# Patient Record
Sex: Male | Born: 1985
Health system: Southern US, Community
[De-identification: ages and names within clinical notes are randomized; demographics above are authoritative.]

## PROBLEM LIST (undated history)

## (undated) DIAGNOSIS — F1111 Opioid abuse, in remission: Secondary | ICD-10-CM

## (undated) HISTORY — PX: JOINT REPLACEMENT: SHX530

---
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
## 2004-01-02 ENCOUNTER — Emergency Department (HOSPITAL_COMMUNITY): Admission: EM | Admit: 2004-01-02 | Discharge: 2004-01-02 | Payer: Self-pay | Admitting: Emergency Medicine

## 2004-08-02 ENCOUNTER — Emergency Department (HOSPITAL_COMMUNITY): Admission: EM | Admit: 2004-08-02 | Discharge: 2004-08-03 | Payer: Self-pay | Admitting: Emergency Medicine

## 2004-09-13 ENCOUNTER — Emergency Department (HOSPITAL_COMMUNITY): Admission: EM | Admit: 2004-09-13 | Discharge: 2004-09-13 | Payer: Self-pay | Admitting: *Deleted

## 2004-12-31 ENCOUNTER — Emergency Department (HOSPITAL_COMMUNITY): Admission: EM | Admit: 2004-12-31 | Discharge: 2004-12-31 | Payer: Self-pay | Admitting: Emergency Medicine

## 2005-05-13 ENCOUNTER — Emergency Department (HOSPITAL_COMMUNITY): Admission: EM | Admit: 2005-05-13 | Discharge: 2005-05-13 | Payer: Self-pay | Admitting: Emergency Medicine

## 2006-07-12 ENCOUNTER — Emergency Department (HOSPITAL_COMMUNITY): Admission: EM | Admit: 2006-07-12 | Discharge: 2006-07-13 | Payer: Self-pay | Admitting: Emergency Medicine

## 2006-10-03 ENCOUNTER — Emergency Department (HOSPITAL_COMMUNITY): Admission: EM | Admit: 2006-10-03 | Discharge: 2006-10-03 | Payer: Self-pay | Admitting: Family Medicine

## 2007-11-07 ENCOUNTER — Emergency Department (HOSPITAL_COMMUNITY): Admission: EM | Admit: 2007-11-07 | Discharge: 2007-11-07 | Payer: Self-pay | Admitting: Emergency Medicine

## 2009-10-03 ENCOUNTER — Ambulatory Visit: Payer: Self-pay

## 2010-01-20 ENCOUNTER — Emergency Department (HOSPITAL_COMMUNITY): Admission: EM | Admit: 2010-01-20 | Discharge: 2010-01-20 | Payer: Self-pay | Admitting: Emergency Medicine

## 2010-03-17 ENCOUNTER — Emergency Department: Payer: Self-pay | Admitting: Emergency Medicine

## 2011-01-30 ENCOUNTER — Emergency Department: Payer: Self-pay | Admitting: Emergency Medicine

## 2011-03-06 ENCOUNTER — Emergency Department: Payer: Self-pay | Admitting: Emergency Medicine

## 2011-03-11 LAB — HEMOCCULT GUIAC POC 1CARD (OFFICE): Fecal Occult Bld: NEGATIVE

## 2011-05-11 NOTE — Op Note (Signed)
NAME:  TYRICK, DUNAGAN NO.:  [id number]   MEDICAL RECORD NO.:  [id number]                   PATIENT TYPE:  OIB   LOCATION:  2856                                 FACILITY:  [facility]   PHYSICIAN:  Burnard Bunting, M.D.                 DATE OF BIRTH:  13-Dec-1986   DATE OF PROCEDURE:  08/10/2003  DATE OF DISCHARGE:  08/10/2003                                 OPERATIVE REPORT   PREOPERATIVE DIAGNOSIS:  Left knee locking.   POSTOPERATIVE DIAGNOSIS:  Left knee plica.   PROCEDURE:  Left knee diagnostic and operative arthroscopy with plica  excision.   SURGEON:  Burnard Bunting, M.D.   ASSISTANT:  Jerolyn Shin. Tresa Res, M.D.   ANESTHESIA:  General endotracheal.   ESTIMATED BLOOD LOSS:  3 mL.   DRAINS:  None.   OPERATIVE FINDINGS:  1. Examination under anesthesia:  Range of motion 0-130 degrees with     stability to varus and valgus stress.  ACL, PCL intact.  Angle blocked,     and no posterior rotatory instability is noted.  2. Diagnostic and operative arthroscopy:     a. Intact suprapatellar pouch.     b. No loose bodies, medial or lateral gutter.     c. Thickened and inflamed medial plica.     d. Intact ACL, PCL.     e. Intact medial compartment with stable meniscus.     f. Intact lateral compartment with stable meniscus, specifically no risk        for a variant-type meniscus was found.     g. No loose bodies present in any compartment including the posterior        compartment.   PROCEDURE IN DETAIL:  The patient was brought to the operating room, where  general endotracheal anesthesia was induced, preoperative IV antibiotics  were administered, and the left leg was prepped with Duraprep solution and  draped in a sterile manner.  The topographical anatomy of the left knee was  identified including the medial and lateral margin of the patellar tendon as  well as the joint line on both the medial and lateral side.  Anterior  inferior lateral portal was  first established.  Diagnostic arthroscopy was  performed.  An anterior inferior medial port was created under direct  visualization.  The patellofemoral compartment was intact, medial and  lateral gutters were free of loose bodies.  The medial compartment was  intact, including articular cartilage of the meniscus.  ACL, PCL intact.  Lateral compartment was intact and the meniscus was extensively probed and  found to be stable.  No risk for a variant-type meniscus was present.  The  posterior compartment was entered and no loose bodies were seen.  There was  a thickened plica, which was inflamed and hypertrophic.  This was resected  with the basket, punch, and forceps.  At this  time the knee joint was thoroughly irrigated.  Instruments were  removed.  Portals were closed using 3-0 nylon suture, bulky dressing and ice  pack were applied.  The patient tolerated the procedure well without  immediate complications.                                               Burnard Bunting, M.D.    GSD/MEDQ  D:  09/23/2003  T:  09/23/2003  Job:  [id number]

## 2011-06-18 ENCOUNTER — Emergency Department: Payer: Self-pay | Admitting: Emergency Medicine

## 2011-09-22 ENCOUNTER — Emergency Department: Payer: Self-pay | Admitting: Emergency Medicine

## 2012-05-08 ENCOUNTER — Telehealth: Payer: Self-pay

## 2012-05-08 NOTE — Telephone Encounter (Signed)
[facility] [place] states that pt would like a refill on the generic ultracet. [facility] [place] [phone]

## 2012-05-10 NOTE — Telephone Encounter (Signed)
PT HASN'T BEEN SEEN HERE SINCE 07/12/10. LMOM TO INFORM HIM THAT HE WILL NEED OV.

## 2012-08-31 ENCOUNTER — Emergency Department: Payer: Self-pay | Admitting: Emergency Medicine

## 2012-09-03 ENCOUNTER — Inpatient Hospital Stay: Payer: Self-pay | Admitting: Internal Medicine

## 2012-09-03 LAB — BASIC METABOLIC PANEL
BUN: 16 mg/dL (ref 7–18)
Calcium, Total: 9.4 mg/dL (ref 8.5–10.1)
Chloride: 106 mmol/L (ref 98–107)
Co2: 27 mmol/L (ref 21–32)
Creatinine: 0.84 mg/dL (ref 0.60–1.30)
EGFR (African American): >60
Glucose: 109 mg/dL — ABNORMAL HIGH (ref 65–99)
Potassium: 3.7 mmol/L (ref 3.5–5.1)
Sodium: 140 mmol/L (ref 136–145)

## 2012-09-03 LAB — CBC
HGB: 14.4 g/dL (ref 13.0–18.0)
Platelet: 270 10*3/uL (ref 150–440)
RBC: 4.79 10*6/uL (ref 4.40–5.90)

## 2012-09-04 LAB — BASIC METABOLIC PANEL
Calcium, Total: 8.7 mg/dL (ref 8.5–10.1)
Chloride: 104 mmol/L (ref 98–107)
Co2: 28 mmol/L (ref 21–32)
EGFR (African American): >60
EGFR (Non-African Amer.): >60
Osmolality: 278 (ref 275–301)
Potassium: 3.6 mmol/L (ref 3.5–5.1)

## 2012-09-04 LAB — CBC WITH DIFFERENTIAL/PLATELET
Basophil #: 0.1 10*3/uL (ref 0.0–0.1)
Eosinophil #: 0.7 10*3/uL (ref 0.0–0.7)
HGB: 13.6 g/dL (ref 13.0–18.0)
Lymphocyte #: 4 10*3/uL — ABNORMAL HIGH (ref 1.0–3.6)
MCH: 30.8 pg (ref 26.0–34.0)
MCV: 89 fL (ref 80–100)
Monocyte #: 0.9 x10 3/mm (ref 0.2–1.0)
Platelet: 242 10*3/uL (ref 150–440)
RBC: 4.43 10*6/uL (ref 4.40–5.90)
RDW: 12.9 % (ref 11.5–14.5)

## 2012-09-05 LAB — VANCOMYCIN, TROUGH: Vancomycin, Trough: 17 ug/mL (ref 10–20)

## 2012-09-09 LAB — CULTURE, BLOOD (SINGLE)

## 2013-11-18 ENCOUNTER — Emergency Department: Payer: Self-pay | Admitting: Emergency Medicine

## 2013-12-15 ENCOUNTER — Emergency Department: Payer: Self-pay | Admitting: Emergency Medicine

## 2013-12-15 LAB — COMPREHENSIVE METABOLIC PANEL
Albumin: 3.8 g/dL (ref 3.4–5.0)
Alkaline Phosphatase: 70 U/L
Bilirubin,Total: 0.3 mg/dL (ref 0.2–1.0)
Calcium, Total: 8.8 mg/dL (ref 8.5–10.1)
Co2: 30 mmol/L (ref 21–32)
EGFR (Non-African Amer.): >60
Osmolality: 278 (ref 275–301)
SGPT (ALT): 19 U/L (ref 12–78)
Sodium: 140 mmol/L (ref 136–145)

## 2013-12-15 LAB — CBC WITH DIFFERENTIAL/PLATELET
Basophil #: 0.1 10*3/uL (ref 0.0–0.1)
Basophil %: 0.6 %
Lymphocyte %: 33.6 %
MCH: 30.1 pg (ref 26.0–34.0)
MCV: 89 fL (ref 80–100)
Monocyte #: 0.7 x10 3/mm (ref 0.2–1.0)
Neutrophil #: 4.8 10*3/uL (ref 1.4–6.5)
Platelet: 246 10*3/uL (ref 150–440)
WBC: 9 10*3/uL (ref 3.8–10.6)

## 2015-04-12 NOTE — Discharge Summary (Signed)
PATIENT NAME:  Nathan [name], Nathan [name] MR#:  [id number] DATE OF BIRTH:  [birth year]  DATE OF ADMISSION:  09/03/2012 DATE OF DISCHARGE:  09/05/2012  PRESENTING COMPLAINT: Right knee erythema and pain.    DISCHARGE DIAGNOSES:  1. Right knee cellulitis, improved.  2. Right leg pain.   MEDICATIONS AT DISCHARGE:  1. Clindamycin 300 mg p.o. q.i.d. for 10 days.  2. Ibuprofen 400 mg every eight hours.  3. Acetaminophen/oxycodone 325/5, 1 tablet every eight hours as needed.   DIET: Regular.   FOLLOW UP: Follow up with urgent care or ER as needed.   LABORATORY, DIAGNOSTIC AND RADIOLOGICAL DATA: Blood cultures negative in 36 hours.   CBC within normal limits. Right knee x-ray shows no joint effusion. Chemistries within normal limits.   BRIEF SUMMARY OF HOSPITAL COURSE: Mr. Nathan [name] is a 29 year old Caucasian gentleman with no significant past medical history other than smoking comes in with:  1. Worsening right knee cellulitis. He was admitted with right knee cellulitis. Patient did have some erythema and tenderness. White count was normal. No fever was noted. He has history of MRSA, was started on IV vancomycin. Patient's erythema almost resolved prior to discharge, was changed back to p.o. clindamycin which he already has, will complete the course. Blood cultures remained negative. X-ray of the knee showed no effusion and patient was also recommended to take ibuprofen 400 mg t.i.d. nausea, vomiting, diarrhea improved.  2. Tobacco use disorder. Smoking cessation advised.  3. Patient was seen by physical therapy, tolerated ambulation well and does not have any physical therapy needs at home.    TIME SPENT: 40 minutes.  ____________________________ Wylie HailSona A. Allena KatzPatel, MD sap:cms D: 09/05/2012 12:34:13 ET T: 09/06/2012 09:39:18 ET JOB#: [id number]  cc: Antwain Caliendo A. Allena KatzPatel, MD, <Dictator> Willow OraSONA A Marysol Wellnitz MD ELECTRONICALLY SIGNED 09/08/2012 22:16

## 2015-04-12 NOTE — H&P (Signed)
PATIENT NAME:  Nathan Nathan [name], Nathan Nathan [name]#:  [id number] DATE OF BIRTH:  1986-07-21  DATE OF ADMISSION:  09/03/2012  ADMITTING PHYSICIAN: Enid Baas, MD   PRIMARY CARE PHYSICIAN: None.   CHIEF COMPLAINT: Right knee pain and worsening erythema.   HISTORY OF PRESENT ILLNESS: Nathan [name]. Nathan Nathan [name] is a 29 year old young Caucasian male with no significant past medical history other than smoking, presented to the ED with worsening right knee pain and also erythema. 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 He denies any trauma to the knee but has noticed  like a small boil and then redness with swelling around the right knee. He came to the ER about three days ago, was discharged home on clindamycin 300 mg q.i.d. Prior to that, he had a few Bactrim pills at home which he had tried without any help. In spite of Clindamycin, the redness had started spreading up to his thigh and down his leg, and he had a fever of 102 degrees Fahrenheit last night. He presents back to the ER.  He is also complaining of some nausea, vomiting and a two-day history of diarrhea which improved today. So, he is being admitted for cellulitis, failing outpatient treatment.   PAST MEDICAL HISTORY: Tobacco use disorder.   PAST SURGICAL HISTORY:  1. Torn anterior cruciate ligament and meniscal repair of the left knee, football injury.  2. Right shoulder repair three years ago from a trauma.  3. Left arm repair from a trauma.  4. Both forearms repaired from trauma.   ALLERGIES: He is intolerance to alcohol.   HOME MEDICATIONS:  He is currently using clindamycin 300 mg p.o. every 6 hours and also Norco for pain started three days ago by the ER.   SOCIAL HISTORY: He lives at home with his wife and four kids.  He smokes 1/2 pack per day and denies any alcohol use.  FAMILY HISTORY: Father with diabetes, hypertension. Mom is otherwise healthy.   REVIEW OF SYSTEMS: CONSTITUTIONAL: Positive for fever. No  fatigue or weakness. EYES: No blurred vision, double vision, glaucoma, or cataracts. ENT: No tinnitus, ear pain, epistaxis or discharge. Positive for headache. RESPIRATORY: No cough, wheeze, hemoptysis, or chronic obstructive pulmonary disease. CARDIOVASCULAR: No chest pain, orthopnea, edema, arrhythmia, or palpitations. GASTROINTESTINAL: Positive for nausea, vomiting, and diarrhea. No abdominal pain, hematemesis, or rectal bleed. GENITOURINARY: No dysuria, hematuria, renal calculus, frequency, or incontinence. ENDOCRINE: No polyuria, nocturia, thyroid problems, heat or cold intolerance. HEMATOLOGY: No anemia, easy bruising or bleeding. SKIN: Positive for cellulitis, rash on the right leg from the knee spreading to the  medial side of the thigh. NEUROLOGIC: No numbness, weakness, cerebrovascular accident, transient ischemic attack, or seizures. PSYCHOLOGICAL: No anxiety, insomnia, or depression.   PHYSICAL EXAMINATION:  VITAL SIGNS: Temperature here is 97 degrees Fahrenheit, pulse 94, respirations 20, blood pressure 143/77, pulse oximetry 97% on room air.   GENERAL: Well built, well nourished male, sitting in chair not in any acute distress.   HEENT: Normocephalic, atraumatic. Pupils are equal, round, reacting to light. Anicteric sclerae. Extraocular movements intact. Oropharynx clear without erythema, mass or exudates.   NECK: Supple, no thyromegaly, jugular venous distention or carotid bruits. No lymphadenopathy.   LUNGS: Moving air bilaterally. No wheeze or crackles. No use of accessory muscles for breathing.   CARDIOVASCULAR: S1, S2 regular rate and rhythm. No murmurs, rubs, or gallops.   ABDOMEN: Soft, nontender, nondistended. No hepatosplenomegaly. Normal bowel sounds.   EXTREMITIES: No pedal  edema. No clubbing or cyanosis. There is erythema with soft tissue swelling especially on the medial side of the knees spreading onto the medial side of the high and also patchy erythema seen spreading  down to the leg, too, on the right side. It is very tender to touch, especially along the medial side of the knee.   SKIN: Other than above-mentioned rash, there are no rashes or lesions.  NEUROLOGIC: Cranial nerves intact. No focal motor or sensory deficits.   PSYCHOLOGICAL: The patient is awake, alert, oriented x3.   LABORATORY, DIAGNOSTIC AND RADIOLOGICAL DATA: WBC 10.6, hemoglobin 14.4, hematocrit 43.1, platelet count 270. Sodium 140, potassium 3.7, chloride 106, bicarbonate  27, BUN 16, creatinine 0.84, glucose 109, and calcium of 9.4. Right knee x-ray showing no acute abnormalities of the right knee. No evidence of fracture or dislocation. No joint effusion is seen. Overlying soft tissues exhibit no gas collection or foreign body.   ASSESSMENT/PLAN: A 29 year old male with no significant  past medical history other than smoking, comes for worsening right knee cellulitis failing outpatient clindamycin.   1. Right knee cellulitis: Not improved on clindamycin started three days ago at the Emergency Room with spreading, erythema, more tenderness and also fevers at home. No white count here. Prior history of methicillin-resistant Staphylococcus aureus. We will admit and start IV fluids and IV vancomycin for now and monitor.  2. Nausea, vomiting, diarrhea: Could have been side effects to clindamycin. He does not have any diarrhea now. If he has further diarrhea, we will need to check for C. difficile.  3. Tobacco use disorder: He was counseled for three minutes and started on a nicotine patch.  4. Right knee tenderness: Likely from cellulitis. No joint effusion on x-ray.   CODE STATUS:  FULL CODE.    TIME SPENT ON ADMISSION: 50 minutes.    ____________________________ Enid Baas, MD rk:cbb D: 09/03/2012 17:31:36 ET T: 09/03/2012 17:57:30 ET JOB#: [id number]  cc: Enid Baas, MD, <Dictator> Enid Baas MD ELECTRONICALLY SIGNED 09/03/2012 19:00

## 2015-09-30 ENCOUNTER — Ambulatory Visit (INDEPENDENT_AMBULATORY_CARE_PROVIDER_SITE_OTHER): Payer: PRIVATE HEALTH INSURANCE | Admitting: Emergency Medicine

## 2015-09-30 VITALS — BP 104/72 | HR 61 | Temp 97.8°F | Resp 16 | Ht 71.0 in | Wt 215.0 lb

## 2015-09-30 DIAGNOSIS — J0140 Acute pansinusitis, unspecified: Secondary | ICD-10-CM

## 2015-09-30 MED ORDER — AMOXICILLIN-POT CLAVULANATE 875-125 MG PO TABS: 1.0000 | ORAL_TABLET | Freq: Two times a day (BID) | ORAL | Status: DC

## 2015-09-30 MED ORDER — PSEUDOEPHEDRINE-GUAIFENESIN ER 60-600 MG PO TB12: 1.0000 | ORAL_TABLET | Freq: Two times a day (BID) | ORAL | Status: AC

## 2015-09-30 NOTE — Patient Instructions (Signed)

## 2015-09-30 NOTE — Progress Notes (Signed)
Subjective:  Patient ID: Nathan [name], male    DOB: Nov 04, 1986  Age: 29 y.o. MRN: [id number]  CC: Sore Throat; Fatigue; and Ear Pain   HPI Nathan [name] presents  with sore throat. He says both his children have had strep throat. He has no fever or chills. Has nasal congestion with postnasal green drainage. He has a cough that's intermittent. He has no wheezing or shortness of breath. Has no nausea vomiting. No stool change. No rash. He has no improvement with over-the-counter medication.  History Nathan [name] has no past medical history on file.   He has past surgical history that includes Joint replacement.   His  family history includes Diabetes in his father; Hyperlipidemia in his father and mother.  He   reports that he has never smoked. He uses smokeless tobacco. His alcohol and drug histories are not on file.  No outpatient prescriptions prior to visit.   No facility-administered medications prior to visit.    Social History   Social History  . Marital Status: Single    Spouse Name: N/A  . Number of Children: N/A  . Years of Education: N/A   Social History Main Topics  . Smoking status: Never Smoker   . Smokeless tobacco: Current User  . Alcohol Use: None  . Drug Use: None  . Sexual Activity: Not Asked   Other Topics Concern  . None   Social History Narrative  . None     Review of Systems  Constitutional: Positive for fatigue. Negative for fever, chills and appetite change.  HENT: Positive for congestion, postnasal drip, rhinorrhea and sore throat. Negative for ear pain and sinus pressure.   Eyes: Negative for pain and redness.  Respiratory: Negative for cough, shortness of breath and wheezing.   Cardiovascular: Negative for leg swelling.  Gastrointestinal: Negative for nausea, vomiting, abdominal pain, diarrhea, constipation and blood in stool.  Endocrine: Negative for polyuria.  Genitourinary: Negative for dysuria, urgency, frequency and flank pain.    Musculoskeletal: Negative for gait problem.  Skin: Negative for rash.  Neurological: Negative for weakness and headaches.  Psychiatric/Behavioral: Negative for confusion and decreased concentration. The patient is not nervous/anxious.     Objective:  BP 104/72 mmHg  Pulse 61  Temp(Src) 97.8 F (36.6 C) (Oral)  Resp 16  Ht  (1.803 m)  Wt 215 lb (97.523 kg)  BMI 30.00 kg/m2  SpO2 98%  Physical Exam  Constitutional: He is oriented to person, place, and time. He appears well-developed and well-nourished. No distress.  HENT:  Head: Normocephalic and atraumatic.  Right Ear: External ear normal.  Left Ear: External ear normal.  Nose: Nose normal.  Eyes: Conjunctivae and EOM are normal. Pupils are equal, round, and reactive to light. No scleral icterus.  Neck: Normal range of motion. Neck supple. No tracheal deviation present.  Cardiovascular: Normal rate, regular rhythm and normal heart sounds.   Pulmonary/Chest: Effort normal. No respiratory distress. He has no wheezes. He has no rales.  Abdominal: He exhibits no mass. There is no tenderness. There is no rebound and no guarding.  Musculoskeletal: He exhibits no edema.  Lymphadenopathy:    He has no cervical adenopathy.  Neurological: He is alert and oriented to person, place, and time. Coordination normal.  Skin: Skin is warm and dry. No rash noted.  Psychiatric: He has a normal mood and affect. His behavior is normal.      Assessment & Plan:   Nathan [name] was seen today for sore throat, fatigue  and ear pain.  Diagnoses and all orders for this visit:  Acute pansinusitis, recurrence not specified  Other orders -     pseudoephedrine-guaifenesin (MUCINEX D) 60-600 MG 12 hr tablet; Take 1 tablet by mouth every 12 (twelve) hours. -     amoxicillin-clavulanate (AUGMENTIN) 875-125 MG tablet; Take 1 tablet by mouth 2 (two) times daily.  I am having Nathan [name] start on pseudoephedrine-guaifenesin and  amoxicillin-clavulanate.  Meds ordered this encounter  Medications  . pseudoephedrine-guaifenesin (MUCINEX D) 60-600 MG 12 hr tablet    Sig: Take 1 tablet by mouth every 12 (twelve) hours.    Dispense:  18 tablet    Refill:  0  . amoxicillin-clavulanate (AUGMENTIN) 875-125 MG tablet    Sig: Take 1 tablet by mouth 2 (two) times daily.    Dispense:  20 tablet    Refill:  0    Appropriate red flag conditions were discussed with the patient as well as actions that should be taken.  Patient expressed his understanding.  Follow-up: Return if symptoms worsen or fail to improve.  Nathan Dane, MD

## 2016-12-23 ENCOUNTER — Emergency Department: Payer: PRIVATE HEALTH INSURANCE

## 2016-12-23 ENCOUNTER — Encounter: Payer: Self-pay | Admitting: Emergency Medicine

## 2016-12-23 ENCOUNTER — Observation Stay
Admission: EM | Admit: 2016-12-23 | Discharge: 2016-12-24 | Disposition: A | Discharge status: Home / Self Care | Payer: PRIVATE HEALTH INSURANCE | Attending: Internal Medicine | Admitting: Internal Medicine

## 2016-12-23 DIAGNOSIS — K529 Noninfective gastroenteritis and colitis, unspecified: Principal | ICD-10-CM | POA: Insufficient documentation

## 2016-12-23 DIAGNOSIS — I951 Orthostatic hypotension: Secondary | ICD-10-CM | POA: Diagnosis present

## 2016-12-23 DIAGNOSIS — K760 Fatty (change of) liver, not elsewhere classified: Secondary | ICD-10-CM | POA: Diagnosis not present

## 2016-12-23 DIAGNOSIS — E860 Dehydration: Secondary | ICD-10-CM | POA: Diagnosis not present

## 2016-12-23 DIAGNOSIS — R000 Tachycardia, unspecified: Secondary | ICD-10-CM | POA: Insufficient documentation

## 2016-12-23 DIAGNOSIS — R1110 Vomiting, unspecified: Secondary | ICD-10-CM

## 2016-12-23 DIAGNOSIS — R112 Nausea with vomiting, unspecified: Secondary | ICD-10-CM | POA: Diagnosis present

## 2016-12-23 DIAGNOSIS — R197 Diarrhea, unspecified: Secondary | ICD-10-CM

## 2016-12-23 HISTORY — DX: Opioid abuse, in remission: F11.11

## 2016-12-23 LAB — CBC
HEMATOCRIT: 50.7 % (ref 40.0–52.0)
HEMOGLOBIN: 17.7 g/dL (ref 13.0–18.0)
MCH: 30.7 pg (ref 26.0–34.0)
MCHC: 34.8 g/dL (ref 32.0–36.0)
MCV: 88 fL (ref 80.0–100.0)
Platelets: 310 10*3/uL (ref 150–440)
RBC: 5.76 MIL/uL (ref 4.40–5.90)
RDW: 12.7 % (ref 11.5–14.5)
WBC: 17.3 10*3/uL — AB (ref 3.8–10.6)

## 2016-12-23 LAB — GASTROINTESTINAL PANEL BY PCR, STOOL (REPLACES STOOL CULTURE)
ADENOVIRUS F40/41: NOT DETECTED
Astrovirus: NOT DETECTED
CAMPYLOBACTER SPECIES: NOT DETECTED
CRYPTOSPORIDIUM: NOT DETECTED
CYCLOSPORA CAYETANENSIS: NOT DETECTED
ENTEROPATHOGENIC E COLI (EPEC): NOT DETECTED
Entamoeba histolytica: NOT DETECTED
Enteroaggregative E coli (EAEC): NOT DETECTED
Enterotoxigenic E coli (ETEC): NOT DETECTED
Giardia lamblia: NOT DETECTED
Norovirus GI/GII: NOT DETECTED
PLESIMONAS SHIGELLOIDES: NOT DETECTED
Rotavirus A: NOT DETECTED
SAPOVIRUS (I, II, IV, AND V): NOT DETECTED
Salmonella species: NOT DETECTED
Shiga like toxin producing E coli (STEC): NOT DETECTED
Shigella/Enteroinvasive E coli (EIEC): NOT DETECTED
VIBRIO SPECIES: NOT DETECTED
Vibrio cholerae: NOT DETECTED
YERSINIA ENTEROCOLITICA: NOT DETECTED

## 2016-12-23 LAB — COMPREHENSIVE METABOLIC PANEL
ALBUMIN: 4.9 g/dL (ref 3.5–5.0)
ALK PHOS: 78 U/L (ref 38–126)
ALT: 37 U/L (ref 17–63)
AST: 39 U/L (ref 15–41)
Anion gap: 7 (ref 5–15)
BILIRUBIN TOTAL: 0.3 mg/dL (ref 0.3–1.2)
BUN: 17 mg/dL (ref 6–20)
CALCIUM: 9.7 mg/dL (ref 8.9–10.3)
CO2: 29 mmol/L (ref 22–32)
Chloride: 103 mmol/L (ref 101–111)
Creatinine, Ser: 1.2 mg/dL (ref 0.61–1.24)
GFR calc Af Amer: >60 mL/min (ref >60)
GFR calc non Af Amer: >60 mL/min (ref >60)
GLUCOSE: 121 mg/dL — AB (ref 65–99)
Potassium: 3.4 mmol/L — ABNORMAL LOW (ref 3.5–5.1)
Sodium: 139 mmol/L (ref 135–145)
TOTAL PROTEIN: 8.7 g/dL — AB (ref 6.5–8.1)

## 2016-12-23 LAB — LIPASE, BLOOD: Lipase: 18 U/L (ref 11–51)

## 2016-12-23 LAB — C DIFFICILE QUICK SCREEN W PCR REFLEX
C DIFFICILE (CDIFF) INTERP: NOT DETECTED
C Diff antigen: NEGATIVE
C Diff toxin: NEGATIVE

## 2016-12-23 LAB — LACTIC ACID, PLASMA: LACTIC ACID, VENOUS: 1 mmol/L (ref 0.5–1.9)

## 2016-12-23 MED ORDER — ONDANSETRON HCL 4 MG PO TABS: 4.0000 mg | ORAL_TABLET | Freq: Four times a day (QID) | ORAL | Status: DC | PRN

## 2016-12-23 MED ORDER — SODIUM CHLORIDE 0.9 % IV BOLUS (SEPSIS)
1000.0000 mL | Freq: Once | INTRAVENOUS | Status: AC
  Administered 2016-12-23: 1000 mL via INTRAVENOUS

## 2016-12-23 MED ORDER — SODIUM CHLORIDE 0.9 % IV BOLUS (SEPSIS): 1000.0000 mL | Freq: Once | INTRAVENOUS | Status: DC

## 2016-12-23 MED ORDER — LOPERAMIDE HCL 2 MG PO CAPS
2.0000 mg | ORAL_CAPSULE | Freq: Once | ORAL | Status: AC
  Administered 2016-12-23: 2 mg via ORAL
  Filled 2016-12-23: qty 1

## 2016-12-23 MED ORDER — LOPERAMIDE HCL 2 MG PO CAPS: 2.0000 mg | ORAL_CAPSULE | ORAL | Status: DC | PRN

## 2016-12-23 MED ORDER — ONDANSETRON HCL 4 MG/2ML IJ SOLN
4.0000 mg | Freq: Four times a day (QID) | INTRAMUSCULAR | Status: DC | PRN
Start: 2016-12-23 — End: 2016-12-24

## 2016-12-23 MED ORDER — BUPRENORPHINE HCL 2 MG SL SUBL
2.0000 mg | SUBLINGUAL_TABLET | Freq: Every day | SUBLINGUAL | Status: DC
  Filled 2016-12-23: qty 1

## 2016-12-23 MED ORDER — ACETAMINOPHEN 650 MG RE SUPP: 650.0000 mg | Freq: Four times a day (QID) | RECTAL | Status: DC | PRN

## 2016-12-23 MED ORDER — IOPAMIDOL (ISOVUE-300) INJECTION 61%
100.0000 mL | Freq: Once | INTRAVENOUS | Status: AC | PRN
  Administered 2016-12-23: 100 mL via INTRAVENOUS

## 2016-12-23 MED ORDER — ACETAMINOPHEN 325 MG PO TABS
650.0000 mg | ORAL_TABLET | Freq: Four times a day (QID) | ORAL | Status: DC | PRN
  Administered 2016-12-24: 650 mg via ORAL
  Filled 2016-12-23: qty 2

## 2016-12-23 MED ORDER — SODIUM CHLORIDE 0.9 % IV SOLN
INTRAVENOUS | Status: AC
  Administered 2016-12-24 (×2): via INTRAVENOUS

## 2016-12-23 MED ORDER — ENOXAPARIN SODIUM 40 MG/0.4ML ~~LOC~~ SOLN: 40.0000 mg | SUBCUTANEOUS | Status: DC

## 2016-12-23 NOTE — ED Notes (Signed)
Pt assisted to restroom.  

## 2016-12-23 NOTE — ED Triage Notes (Addendum)
Pt arrived via EMS from home with reports of diarrhea and nausea for the pas 2 - 2.5 hours.  Pt states diarrhea has been non stop for the past 2 hours.  Vomited several times. Mouth dry, received 8mg  IV zofran and 500 cc NaCl Pt takes Suboxone and is in a program. Pt states this is not w/d sxs

## 2016-12-23 NOTE — ED Provider Notes (Signed)
[facility] Emergency Department Provider Note   ____________________________________________   First MD Initiated Contact with Patient 12/23/16 1652     (approximate)  I have reviewed the triage vital signs and the nursing notes.   HISTORY  Chief Complaint Diarrhea and Emesis    HPI Nathan [name] is a 30 y.o. male ports no significant medical history other than previous opioid use Suboxone use currently  Patient visiting family today about 2 hours agohe developed sudden onset of stomach cramps, multiple loose watery stools, vomiting "stomach acid" multiple times in the last 2 hours. Reports numerous amounts of vomiting as well as loose stools. Crampy abdominal pain. No fevers or chills. No bad food or travel. No recent uncooked food. Reports no known sick contacts.  No chest pain or shortness of breath. He felt very lightheaded after having multiple rounds of stool and called EMS. He was initially found to be tachycardic with a heart rate about 140, given normal saline Route with improvement. Also Zofran with no further vomiting.   History reviewed. No pertinent past medical history.  There are no active problems to display for this patient.   Past Surgical History:  Procedure Laterality Date  . JOINT REPLACEMENT      Prior to Admission medications   Medication Sig Start Date End Date Taking? Authorizing Provider  amoxicillin-clavulanate (AUGMENTIN) 875-125 MG tablet Take 1 tablet by mouth 2 (two) times daily. 09/30/15   Carmelina DaneJeffery S Anderson, MD    Allergies Patient has no known allergies.  Family History  Problem Relation Age of Onset  . Hyperlipidemia Mother   . Hyperlipidemia Father   . Diabetes Father     Social History Social History  Substance Use Topics  . Smoking status: Never Smoker  . Smokeless tobacco: Current User  . Alcohol use Not on file    Review of Systems Constitutional: No fever/chills Eyes: No visual  changes. ENT: No sore throat. Cardiovascular: Denies chest pain. Respiratory: Denies shortness of breath. Gastrointestinal: See history of present illness Genitourinary: Negative for dysuria. Musculoskeletal: Negative for back pain. Skin: Negative for rash. Neurological: Negative for headaches, focal weakness or numbness.  10-point ROS otherwise negative.  ____________________________________________   PHYSICAL EXAM:  VITAL SIGNS: ED Triage Vitals [12/23/16 1634]  Enc Vitals Group     BP 125/89     Pulse Rate (!) 103     Resp 18     Temp 97.4 F (36.3 C)     Temp Source Oral     SpO2 100 %     Weight 184 lb 11.9 oz (83.8 kg)     Height 6\' 1"  (1.854 m)     Head Circumference      Peak Flow      Pain Score      Pain Loc      Pain Edu?      Excl. in GC?     Constitutional: Alert and oriented. Somewhat pale, appears nauseated, generally ill-appearing and appears fatigued but in no obvious extremities. Eyes: Conjunctivae are normal. PERRL. EOMI. Head: Atraumatic. Nose: No congestion/rhinnorhea. Mouth/Throat: Mucous membranes are very dry.  Oropharynx non-erythematous. Neck: No stridor.   Cardiovascular: Tachycardic rate, regular rhythm. Grossly normal heart sounds.  Good peripheral circulation. Respiratory: Normal respiratory effort.  No retractions. Lungs CTAB. Gastrointestinal: Soft and mildly tender throughout, questionably moderate tenderness left lower quadrant greater than remaining quadrants. No rebound or guarding. No focal tenderness at McBurney's point. Musculoskeletal: No lower extremity tenderness nor edema.  Neurologic:  Normal speech and language. No gross focal neurologic deficits are appreciated. Skin:  Skin is cool peripherally, slightly diaphoretic and intact. No rash noted. Psychiatric: Mood and affect are normal. Speech and behavior are normal.  ____________________________________________   LABS (all labs ordered are listed, but only abnormal  results are displayed)  Labs Reviewed  CBC - Abnormal; Notable for the following:       Result Value   WBC 17.3 (*)    All other components within normal limits  COMPREHENSIVE METABOLIC PANEL - Abnormal; Notable for the following:    Potassium 3.4 (*)    Glucose, Bld 121 (*)    Total Protein 8.7 (*)    All other components within normal limits  BLOOD GAS, VENOUS - Abnormal; Notable for the following:    Bicarbonate 28.9 (*)    All other components within normal limits  GASTROINTESTINAL PANEL BY PCR, STOOL (REPLACES STOOL CULTURE)  C DIFFICILE QUICK SCREEN W PCR REFLEX  LIPASE, BLOOD  LACTIC ACID, PLASMA  LACTIC ACID, PLASMA   ____________________________________________  EKG  Reviewed and interpreted by me 1815 Heart rate 1:15 QRS 110 QTc 440 Sinus tachycardia, nonspecific T-wave abnormality ____________________________________________  RADIOLOGY  Dg Abdomen 1 View  Result Date: 12/23/2016 CLINICAL DATA:  Patient with diarrhea and nausea. EXAM: ABDOMEN - 1 VIEW COMPARISON:  CT abdomen pelvis 09/23/2011. FINDINGS: Gas is demonstrated within nondilated loops of large and small bowel in a nonobstructed pattern. Supine evaluation limited for the detection of free intraperitoneal air. Lung bases are clear. Pelvic phlebolith. Osseous skeleton is unremarkable. IMPRESSION: Nonobstructed bowel gas pattern. Electronically Signed   By: Annia Belt M.D.   On: 12/23/2016 17:25   Ct Abdomen Pelvis W Contrast  Result Date: 12/23/2016 CLINICAL DATA:  Diarrhea and nausea EXAM: CT ABDOMEN AND PELVIS WITH CONTRAST TECHNIQUE: Multidetector CT imaging of the abdomen and pelvis was performed using the standard protocol following bolus administration of intravenous contrast. CONTRAST:  ISOVUE-300 IOPAMIDOL (ISOVUE-300) INJECTION 61% COMPARISON:  12/23/2016 radiograph, CT 09/23/2011 FINDINGS: Lower chest: Stable 3 mm subpleural nodule right lower lobe. No acute consolidation or pleural  effusion. Normal heart size. Hepatobiliary: Mild fatty infiltration. Small focal hypodensities near falciform ligament could relate to more focal fatty change. No biliary dilatation. No calcified gallstones. No wall thickening. Pancreas: Unremarkable. No pancreatic ductal dilatation or surrounding inflammatory changes. Spleen: Normal in size without focal abnormality. Adrenals/Urinary Tract: Adrenal glands are unremarkable. Kidneys are normal, without renal calculi, focal lesion, or hydronephrosis. Bladder is unremarkable. Stomach/Bowel: Stomach is nonenlarged. There is no dilated small bowel. Appendix is normal. No significant colon wall thickening. Mild mucosal enhancement of nondilated small bowel loops within the left upper quadrant with scattered mucosal enhancement of the descending and rectosigmoid colon without associated wall thickening. Vascular/Lymphatic: Aorta is of normal caliber. Scattered subcentimeter central mesenteric nonspecific lymph nodes. Reproductive: Prostate is unremarkable. Other: No free air or free fluid. Small amount of fat in the umbilicus. Musculoskeletal: No acute or significant osseous findings. IMPRESSION: 1. No evidence for bowel obstruction, free air or free fluid. Normal appendix. 2. Scattered mucosal enhancement of nondilated small bowel as well as the descending colon and rectosigmoid colon without associated wall thickening. Findings could be secondary to an enteritis. 3. Mild hepatic steatosis Electronically Signed   By: Jasmine Pang M.D.   On: 12/23/2016 19:41    ____________________________________________   PROCEDURES  Procedure(s) performed: None  Procedures  Critical Care performed: No  ____________________________________________   INITIAL IMPRESSION / ASSESSMENT  AND PLAN / ED COURSE  Pertinent labs & imaging results that were available during my care of the patient were reviewed by me and considered in my medical decision making (see chart for  details).  Patient presents for sudden onset severe nausea vomiting and abdominal cramping. Patient continuing to have multiple loose stools, reports projectile emesis at home. Afebrile, but notably tachycardic with EMS arrival with orthostatic like symptoms. No cardiac pulmonary or neurologic abnormality/symptomatology. Based on significance of his symptoms, we'll obtain labs, x-ray, hydrate generously, and he does report vomiting is improved after receiving Zofran with EMS.  ----------------------------------------- 7:14 PM on 12/23/2016 -----------------------------------------  Patient reports feeling somewhat improvedas still had multiple loose very watery brown stools. Sent for culture. No further vomiting, but continues to have loose intractable diarrhea.  Clinical Course    ----------------------------------------- 9:53 PM on 12/23/2016 -----------------------------------------  Patient continuing to feel lightheaded with standing, orthostatics demonstrate heart rate elevates to about 140 with standing and blood pressure drops to the 80s. His artery had 2 L of fluid and I suspect this is likely gastroenteritis given his CT findings and presentation but with severe dehydration and orthostasis. We'll give additional fluid. She reports nausea has improved, and patient agreeable with plan for admission hydration ongoing workup under the hospitalist service.  ____________________________________________   FINAL CLINICAL IMPRESSION(S) / ED DIAGNOSES  Final diagnoses:  Orthostatic hypotension  Vomiting and diarrhea  Dehydration      NEW MEDICATIONS STARTED DURING THIS VISIT:  New Prescriptions   No medications on file     Note:  This document was prepared using Dragon voice recognition software and may include unintentional dictation errors.     Sharyn CreamerMark Quale, MD 12/23/16 2154

## 2016-12-23 NOTE — ED Notes (Signed)
Transporting patient to room 116-1C

## 2016-12-23 NOTE — ED Notes (Signed)
While attempting to hook patient back to monitor and IV patient had to use restroom urgently again.

## 2016-12-23 NOTE — ED Notes (Signed)
Pt still using restroom, denies needs.

## 2016-12-23 NOTE — H&P (Signed)
[facility] - [place] at [facility]   PATIENT NAME: Nathan [name]    MR#:  [id number]  DATE OF BIRTH:  09/17/1986  DATE OF ADMISSION:  12/23/2016  PRIMARY CARE PHYSICIAN: No PCP Per Patient   REQUESTING/REFERRING PHYSICIAN: Fanny Bien, MD  CHIEF COMPLAINT:   Chief Complaint  Patient presents with  . Diarrhea  . Emesis    HISTORY OF PRESENT ILLNESS:  Nathan [name]  is a 30 y.o. male who presents with Nausea vomiting and diarrhea.  Patient states that this afternoon he developed acute onset abdominal cramping with nausea vomiting and diarrhea. He states that he had significant output over the course of about 2 hours in the form of vomiting and diarrhea. He presented to the ED with low blood pressure. After significant volume resuscitation he was still orthostatic, and had recurrent diarrhea here in the ED.  Hospitalists were called for admission  PAST MEDICAL HISTORY:   Past Medical History:  Diagnosis Date  . Narcotic abuse in remission     PAST SURGICAL HISTORY:   Past Surgical History:  Procedure Laterality Date  . JOINT REPLACEMENT      SOCIAL HISTORY:   Social History  Substance Use Topics  . Smoking status: Never Smoker  . Smokeless tobacco: Current User  . Alcohol use No    FAMILY HISTORY:   Family History  Problem Relation Age of Onset  . Hyperlipidemia Mother   . Hyperlipidemia Father   . Diabetes Father     DRUG ALLERGIES:  No Known Allergies  MEDICATIONS AT HOME:   Prior to Admission medications   Medication Sig Start Date End Date Taking? Authorizing Provider  buprenorphine-naloxone (SUBOXONE) 8-2 MG SUBL SL tablet Place 1 tablet under the tongue 2 (two) times daily.    Yes Historical Provider, MD    REVIEW OF SYSTEMS:  Review of Systems  Constitutional: Negative for chills, fever, malaise/fatigue and weight loss.  HENT: Negative for ear pain, hearing loss and tinnitus.   Eyes: Negative for blurred vision, double vision,  pain and redness.  Respiratory: Negative for cough, hemoptysis and shortness of breath.   Cardiovascular: Negative for chest pain, palpitations, orthopnea and leg swelling.  Gastrointestinal: Positive for abdominal pain, diarrhea, nausea and vomiting. Negative for constipation.  Genitourinary: Negative for dysuria, frequency and hematuria.  Musculoskeletal: Negative for back pain, joint pain and neck pain.  Skin:       No acne, rash, or lesions  Neurological: Negative for dizziness, tremors, focal weakness and weakness.  Endo/Heme/Allergies: Negative for polydipsia. Does not bruise/bleed easily.  Psychiatric/Behavioral: Negative for depression. The patient is not nervous/anxious and does not have insomnia.      VITAL SIGNS:   Vitals:   12/23/16 1634 12/23/16 1900 12/23/16 1930 12/23/16 1945  BP: 125/89 123/71 123/73   Pulse: (!) 103 96 (!) 101 96  Resp: 18 14 15 16   Temp: 97.4 F (36.3 C)     TempSrc: Oral     SpO2: 100% 98% 100% 99%  Weight: 83.8 kg (184 lb 11.9 oz)     Height: 6\' 1"  (1.854 m)      Wt Readings from Last 3 Encounters:  12/23/16 83.8 kg (184 lb 11.9 oz)  09/30/15 97.5 kg (215 lb)    PHYSICAL EXAMINATION:  Physical Exam  Vitals reviewed. Constitutional: He is oriented to person, place, and time. He appears well-developed and well-nourished. No distress.  HENT:  Head: Normocephalic and atraumatic.  Dry mucous membranes  Eyes: Conjunctivae and  EOM are normal. Pupils are equal, round, and reactive to light. No scleral icterus.  Neck: Normal range of motion. Neck supple. No JVD present. No thyromegaly present.  Cardiovascular: Normal rate, regular rhythm and intact distal pulses.  Exam reveals no gallop and no friction rub.   No murmur heard. Respiratory: Effort normal and breath sounds normal. No respiratory distress. He has no wheezes. He has no rales.  GI: Soft. Bowel sounds are normal. He exhibits no distension. There is tenderness.  Musculoskeletal: Normal  range of motion. He exhibits no edema.  No arthritis, no gout  Lymphadenopathy:    He has no cervical adenopathy.  Neurological: He is alert and oriented to person, place, and time. No cranial nerve deficit.  No dysarthria, no aphasia  Skin: Skin is warm and dry. No rash noted. No erythema.  Psychiatric: He has a normal mood and affect. His behavior is normal. Judgment and thought content normal.    LABORATORY PANEL:   CBC  Recent Labs Lab 12/23/16 1637  WBC 17.3*  HGB 17.7  HCT 50.7  PLT 310   ------------------------------------------------------------------------------------------------------------------  Chemistries   Recent Labs Lab 12/23/16 1637  NA 139  K 3.4*  CL 103  CO2 29  GLUCOSE 121*  BUN 17  CREATININE 1.20  CALCIUM 9.7  AST 39  ALT 37  ALKPHOS 78  BILITOT 0.3   ------------------------------------------------------------------------------------------------------------------  Cardiac Enzymes No results for input(s): TROPONINI in the last 168 hours. ------------------------------------------------------------------------------------------------------------------  RADIOLOGY:  Dg Abdomen 1 View  Result Date: 12/23/2016 CLINICAL DATA:  Patient with diarrhea and nausea. EXAM: ABDOMEN - 1 VIEW COMPARISON:  CT abdomen pelvis 09/23/2011. FINDINGS: Gas is demonstrated within nondilated loops of large and small bowel in a nonobstructed pattern. Supine evaluation limited for the detection of free intraperitoneal air. Lung bases are clear. Pelvic phlebolith. Osseous skeleton is unremarkable. IMPRESSION: Nonobstructed bowel gas pattern. Electronically Signed   By: Annia Belt M.D.   On: 12/23/2016 17:25   Ct Abdomen Pelvis W Contrast  Result Date: 12/23/2016 CLINICAL DATA:  Diarrhea and nausea EXAM: CT ABDOMEN AND PELVIS WITH CONTRAST TECHNIQUE: Multidetector CT imaging of the abdomen and pelvis was performed using the standard protocol following bolus  administration of intravenous contrast. CONTRAST:  ISOVUE-300 IOPAMIDOL (ISOVUE-300) INJECTION 61% COMPARISON:  12/23/2016 radiograph, CT 09/23/2011 FINDINGS: Lower chest: Stable 3 mm subpleural nodule right lower lobe. No acute consolidation or pleural effusion. Normal heart size. Hepatobiliary: Mild fatty infiltration. Small focal hypodensities near falciform ligament could relate to more focal fatty change. No biliary dilatation. No calcified gallstones. No wall thickening. Pancreas: Unremarkable. No pancreatic ductal dilatation or surrounding inflammatory changes. Spleen: Normal in size without focal abnormality. Adrenals/Urinary Tract: Adrenal glands are unremarkable. Kidneys are normal, without renal calculi, focal lesion, or hydronephrosis. Bladder is unremarkable. Stomach/Bowel: Stomach is nonenlarged. There is no dilated small bowel. Appendix is normal. No significant colon wall thickening. Mild mucosal enhancement of nondilated small bowel loops within the left upper quadrant with scattered mucosal enhancement of the descending and rectosigmoid colon without associated wall thickening. Vascular/Lymphatic: Aorta is of normal caliber. Scattered subcentimeter central mesenteric nonspecific lymph nodes. Reproductive: Prostate is unremarkable. Other: No free air or free fluid. Small amount of fat in the umbilicus. Musculoskeletal: No acute or significant osseous findings. IMPRESSION: 1. No evidence for bowel obstruction, free air or free fluid. Normal appendix. 2. Scattered mucosal enhancement of nondilated small bowel as well as the descending colon and rectosigmoid colon without associated wall thickening. Findings could be  secondary to an enteritis. 3. Mild hepatic steatosis Electronically Signed   By: Jasmine Pang M.D.   On: 12/23/2016 19:41    EKG:   Orders placed or performed during the hospital encounter of 12/23/16  . ED EKG  . ED EKG  . EKG 12-Lead  . EKG 12-Lead  . EKG 12-Lead  . EKG  12-Lead    IMPRESSION AND PLAN:  Principal Problem:   Gastroenteritis - CT of his abdomen and pelvis showed gastroenteritis. No other abnormality noted. He does have a mildly elevated white count, but suspect some possibility of hemoconcentration. C. difficile was negative and acute GI panel was negative. Suspect viral etiology Active Problems:   Dehydration - aggressive IV fluids   Nausea vomiting and diarrhea - aggressive IV fluids, Imodium and antiemetics when necessary   Orthostatic hypotension - treat above problems and recheck orthostatic vital signs in the morning  All the records are reviewed and case discussed with ED provider. Management plans discussed with the patient and/or family.  DVT PROPHYLAXIS: SubQ lovenox  GI PROPHYLAXIS: None  ADMISSION STATUS: Observation  CODE STATUS: Full Code Status History    This patient does not have a recorded code status. Please follow your organizational policy for patients in this situation.      TOTAL TIME TAKING CARE OF THIS PATIENT: 40 minutes.    Velma Agnes FIELDING 12/23/2016, 10:41 PM  [facility]  Office  [phone]  CC: Primary care physician; No PCP Per Patient

## 2016-12-24 LAB — BASIC METABOLIC PANEL
ANION GAP: 5 (ref 5–15)
BUN: 16 mg/dL (ref 6–20)
CO2: 26 mmol/L (ref 22–32)
Calcium: 8.1 mg/dL — ABNORMAL LOW (ref 8.9–10.3)
Chloride: 110 mmol/L (ref 101–111)
Creatinine, Ser: 0.87 mg/dL (ref 0.61–1.24)
GFR calc Af Amer: >60 mL/min (ref >60)
GFR calc non Af Amer: >60 mL/min (ref >60)
GLUCOSE: 103 mg/dL — AB (ref 65–99)
POTASSIUM: 3.6 mmol/L (ref 3.5–5.1)
Sodium: 141 mmol/L (ref 135–145)

## 2016-12-24 LAB — CBC
HEMATOCRIT: 40.1 % (ref 40.0–52.0)
HEMOGLOBIN: 14.3 g/dL (ref 13.0–18.0)
MCH: 31.1 pg (ref 26.0–34.0)
MCHC: 35.5 g/dL (ref 32.0–36.0)
MCV: 87.4 fL (ref 80.0–100.0)
Platelets: 238 10*3/uL (ref 150–440)
RBC: 4.59 MIL/uL (ref 4.40–5.90)
RDW: 12.9 % (ref 11.5–14.5)
WBC: 9.4 10*3/uL (ref 3.8–10.6)

## 2016-12-24 MED ORDER — SODIUM CHLORIDE 0.9 % IV BOLUS (SEPSIS): 1000.0000 mL | INTRAVENOUS | Status: DC | PRN

## 2016-12-24 NOTE — Progress Notes (Signed)
[facility] - [place] at [facility]    Alethia BertholdJohn [name] was admitted to the Hospital on 12/23/2016 and Discharged  12/24/2016 and should be excused from work/school   For 2  days starting 12/23/2016 , may return to work/school without any restrictions.  Call Auburn BilberryShreyang Foday [facility] MD with questions.  Auburn BilberryPATEL, Geovanny Sartin M.D on 12/24/2016,at 2:34 PM  [facility] [place] at [facility]    Office  [phone]

## 2016-12-24 NOTE — Discharge Instructions (Signed)
[facility] - [place] at [facility] ° °DIET:  °Regular diet ° °DISCHARGE CONDITION:  °Stable ° °ACTIVITY:  °Activity as tolerated ° °OXYGEN:  °Home Oxygen: No. °  °Oxygen Delivery: room air ° °DISCHARGE LOCATION:  °home  ° ° °ADDITIONAL DISCHARGE INSTRUCTION: ° ° °If you experience worsening of your admission symptoms, develop shortness of breath, life threatening emergency, suicidal or homicidal thoughts you must seek medical attention immediately by calling 911 or calling your MD immediately  if symptoms less severe. ° °You Must read complete instructions/literature along with all the possible adverse reactions/side effects for all the Medicines you take and that have been prescribed to you. Take any new Medicines after you have completely understood and accpet all the possible adverse reactions/side effects.  ° °Please note ° °You were cared for by a hospitalist during your hospital stay. If you have any questions about your discharge medications or the care you received while you were in the hospital after you are discharged, you can call the unit and asked to speak with the hospitalist on call if the hospitalist that took care of you is not available. Once you are discharged, your primary care physician will handle any further medical issues. Please note that NO REFILLS for any discharge medications will be authorized once you are discharged, as it is imperative that you return to your primary care physician (or establish a relationship with a primary care physician if you do not have one) for your aftercare needs so that they can reassess your need for medications and monitor your lab values. ° ° °

## 2016-12-24 NOTE — Progress Notes (Signed)
Pt is being discharged from the hospital, discharge instructions given to him, he verified understanding. 0 paper prescriptions given to him. Iv x1 removed. All belongings packed and returned to patient. He is waiting for his ride to arrive, he will be wheeled out in a wheelchair by staff.

## 2016-12-24 NOTE — Discharge Summary (Signed)
[facility], [place] y.o., DOB 07/11/1986, MRN [id number]. Admission date: 12/23/2016 Discharge Date 12/24/2016 Primary MD No PCP Per Patient Admitting Physician Oralia Manis, MD  Admission Diagnosis  Orthostatic hypotension [I95.1] Dehydration [E86.0] Vomiting and diarrhea [R11.10, R19.7]  Discharge Diagnosis   Principal Problem:   Gastroenteritis   Dehydration   Nausea vomiting and diarrhea   Orthostatic hypotension           Hospital Course   Nathan [name]  is a 31 y.o. male who presents with Nausea vomiting and diarrhea.  Patient states that this afternoon he developed acute onset abdominal cramping with nausea vomiting and diarrhea. He states that he had significant output over the course of about 2 hours in the form of vomiting and diarrhea. He presented to the ED with low blood pressure. Patient was rehydrated aggressively. And treated with antiemetics. Patient's symptoms are now resolved he is feeling much better. No further diarrhea or vomiting is stable for discharge.           Consults  None  Significant Tests:  See full reports for all details    Dg Abdomen 1 View  Result Date: 12/23/2016 CLINICAL DATA:  Patient with diarrhea and nausea. EXAM: ABDOMEN - 1 VIEW COMPARISON:  CT abdomen pelvis 09/23/2011. FINDINGS: Gas is demonstrated within nondilated loops of large and small bowel in a nonobstructed pattern. Supine evaluation limited for the detection of free intraperitoneal air. Lung bases are clear. Pelvic phlebolith. Osseous skeleton is unremarkable. IMPRESSION: Nonobstructed bowel gas pattern. Electronically Signed   By: Annia Belt M.D.   On: 12/23/2016 17:25   Ct Abdomen Pelvis W Contrast  Result Date: 12/23/2016 CLINICAL DATA:  Diarrhea and nausea EXAM: CT ABDOMEN AND PELVIS WITH CONTRAST TECHNIQUE: Multidetector CT imaging of the abdomen and pelvis was performed using the standard protocol following bolus  administration of intravenous contrast. CONTRAST:  ISOVUE-300 IOPAMIDOL (ISOVUE-300) INJECTION 61% COMPARISON:  12/23/2016 radiograph, CT 09/23/2011 FINDINGS: Lower chest: Stable 3 mm subpleural nodule right lower lobe. No acute consolidation or pleural effusion. Normal heart size. Hepatobiliary: Mild fatty infiltration. Small focal hypodensities near falciform ligament could relate to more focal fatty change. No biliary dilatation. No calcified gallstones. No wall thickening. Pancreas: Unremarkable. No pancreatic ductal dilatation or surrounding inflammatory changes. Spleen: Normal in size without focal abnormality. Adrenals/Urinary Tract: Adrenal glands are unremarkable. Kidneys are normal, without renal calculi, focal lesion, or hydronephrosis. Bladder is unremarkable. Stomach/Bowel: Stomach is nonenlarged. There is no dilated small bowel. Appendix is normal. No significant colon wall thickening. Mild mucosal enhancement of nondilated small bowel loops within the left upper quadrant with scattered mucosal enhancement of the descending and rectosigmoid colon without associated wall thickening. Vascular/Lymphatic: Aorta is of normal caliber. Scattered subcentimeter central mesenteric nonspecific lymph nodes. Reproductive: Prostate is unremarkable. Other: No free air or free fluid. Small amount of fat in the umbilicus. Musculoskeletal: No acute or significant osseous findings. IMPRESSION: 1. No evidence for bowel obstruction, free air or free fluid. Normal appendix. 2. Scattered mucosal enhancement of nondilated small bowel as well as the descending colon and rectosigmoid colon without associated wall thickening. Findings could be secondary to an enteritis. 3. Mild hepatic steatosis Electronically Signed   By: Jasmine Pang M.D.   On: 12/23/2016 19:41       Today   Subjective:   Nathan [name]  denies any nausea vomiting or diarrhea  Objective:   Blood pressure (!) 108/55, pulse 90, temperature 97.8  F (36.6 C), temperature source Oral, resp. rate 18, height 5\' 9"  (1.753 m), weight 183 lb 14.4 oz (83.4 kg), SpO2 99 %.  .  Intake/Output Summary (Last 24 hours) at 12/24/16 1456 Last data filed at 12/24/16 0645  Gross per 24 hour  Intake              970 ml  Output                0 ml  Net              970 ml    Exam VITAL SIGNS: Blood pressure (!) 108/55, pulse 90, temperature 97.8 F (36.6 C), temperature source Oral, resp. rate 18, height 5\' 9"  (1.753 m), weight 183 lb 14.4 oz (83.4 kg), SpO2 99 %.  GENERAL:  31 y.o.-year-old patient lying in the bed with no acute distress.  EYES: Pupils equal, round, reactive to light and accommodation. No scleral icterus. Extraocular muscles intact.  HEENT: Head atraumatic, normocephalic. Oropharynx and nasopharynx clear.  NECK:  Supple, no jugular venous distention. No thyroid enlargement, no tenderness.  LUNGS: Normal breath sounds bilaterally, no wheezing, rales,rhonchi or crepitation. No use of accessory muscles of respiration.  CARDIOVASCULAR: S1, S2 normal. No murmurs, rubs, or gallops.  ABDOMEN: Soft, nontender, nondistended. Bowel sounds present. No organomegaly or mass.  EXTREMITIES: No pedal edema, cyanosis, or clubbing.  NEUROLOGIC: Cranial nerves II through XII are intact. Muscle strength 5/5 in all extremities. Sensation intact. Gait not checked.  PSYCHIATRIC: The patient is alert and oriented x 3.  SKIN: No obvious rash, lesion, or ulcer.   Data Review     CBC w Diff: Lab Results  Component Value Date   WBC 9.4 12/24/2016   HGB 14.3 12/24/2016   HGB 13.9 12/15/2013   HCT 40.1 12/24/2016   HCT 40.9 12/15/2013   PLT 238 12/24/2016   PLT 246 12/15/2013   LYMPHOPCT 33.6 12/15/2013   MONOPCT 8.1 12/15/2013   EOSPCT 4.6 12/15/2013   BASOPCT 0.6 12/15/2013   CMP: Lab Results  Component Value Date   NA 141 12/24/2016   NA 140 12/15/2013   K 3.6 12/24/2016   K 3.2 (L) 12/15/2013   CL 110 12/24/2016   CL 108 (H)  12/15/2013   CO2 26 12/24/2016   CO2 30 12/15/2013   BUN 16 12/24/2016   BUN 8 12/15/2013   CREATININE 0.87 12/24/2016   CREATININE 0.99 12/15/2013   PROT 8.7 (H) 12/23/2016   PROT 7.3 12/15/2013   ALBUMIN 4.9 12/23/2016   ALBUMIN 3.8 12/15/2013   BILITOT 0.3 12/23/2016   BILITOT 0.3 12/15/2013   ALKPHOS 78 12/23/2016   ALKPHOS 70 12/15/2013   AST 39 12/23/2016   AST 24 12/15/2013   ALT 37 12/23/2016   ALT 19 12/15/2013  .  Micro Results Recent Results (from the past 240 hour(s))  Gastrointestinal Panel by PCR , Stool     Status: None   Collection Time: 12/23/16  6:49 PM  Result Value Ref Range Status   Campylobacter species NOT DETECTED NOT DETECTED Final   Plesimonas shigelloides NOT DETECTED NOT DETECTED Final   Salmonella species NOT DETECTED NOT DETECTED Final   Yersinia enterocolitica NOT DETECTED NOT DETECTED Final   Vibrio species NOT DETECTED NOT DETECTED Final   Vibrio cholerae NOT DETECTED NOT DETECTED Final   Enteroaggregative E coli (EAEC) NOT DETECTED NOT DETECTED Final   Enteropathogenic E coli (EPEC) NOT DETECTED NOT DETECTED Final   Enterotoxigenic E coli (  ETEC) NOT DETECTED NOT DETECTED Final   Shiga like toxin producing E coli (STEC) NOT DETECTED NOT DETECTED Final   Shigella/Enteroinvasive E coli (EIEC) NOT DETECTED NOT DETECTED Final   Cryptosporidium NOT DETECTED NOT DETECTED Final   Cyclospora cayetanensis NOT DETECTED NOT DETECTED Final   Entamoeba histolytica NOT DETECTED NOT DETECTED Final   Giardia lamblia NOT DETECTED NOT DETECTED Final   Adenovirus F40/41 NOT DETECTED NOT DETECTED Final   Astrovirus NOT DETECTED NOT DETECTED Final   Norovirus GI/GII NOT DETECTED NOT DETECTED Final   Rotavirus A NOT DETECTED NOT DETECTED Final   Sapovirus (I, II, IV, and V) NOT DETECTED NOT DETECTED Final  C difficile quick scan w PCR reflex     Status: None   Collection Time: 12/23/16  6:49 PM  Result Value Ref Range Status   C Diff antigen NEGATIVE  NEGATIVE Final   C Diff toxin NEGATIVE NEGATIVE Final   C Diff interpretation No C. difficile detected.  Final        Code Status Orders        Start     Ordered   12/23/16 2355  Full code  Continuous     12/23/16 2354    Code Status History    Date Active Date Inactive Code Status Order ID Comments User Context   This patient has a current code status but no historical code status.          Follow-up Information    pcp Follow up.   Why:  as needed          Discharge Medications   Allergies as of 12/24/2016   No Known Allergies     Medication List    TAKE these medications   buprenorphine-naloxone 8-2 MG Subl SL tablet Commonly known as:  SUBOXONE Place 1 tablet under the tongue 2 (two) times daily.          Total Time in preparing paper work, data evaluation and todays exam - 35 minutes  Auburn Bilberry M.D on 12/24/2016 at 2:56 PM  [facility]   Office  [phone]

## 2016-12-28 LAB — BLOOD GAS, VENOUS
Acid-Base Excess: 0.6 mmol/L (ref 0.0–2.0)
Bicarbonate: 28.9 mmol/L — ABNORMAL HIGH (ref 20.0–28.0)
PATIENT TEMPERATURE: 37
pCO2, Ven: 60 mmHg (ref 44.0–60.0)
pH, Ven: 7.29 (ref 7.250–7.430)
pO2, Ven: <31 mmHg — CL (ref 32.0–45.0)

## 2017-04-25 IMAGING — CT CT ABD-PELV W/ CM
2 of 4 series · 15 of 46 positions shown, 17 images · IV contrast (iopamidol)
Comparison: 12/23/2016 radiograph, CT 09/23/2011

CLINICAL DATA: Diarrhea and nausea

EXAM:
CT ABDOMEN AND PELVIS WITH CONTRAST
TECHNIQUE: Multidetector CT imaging of the abdomen and pelvis was performed
using the standard protocol following bolus administration of
intravenous contrast.
CONTRAST:  100mL 4XX1UW-9MM IOPAMIDOL (4XX1UW-9MM) INJECTION 61%

[Series 2: routine abd/pel with · axial · 0.76mm/px · z∈[-490,-35]mm · 12 of 105 slices shown, 14 images]
[im 9/105  soft-tissue]
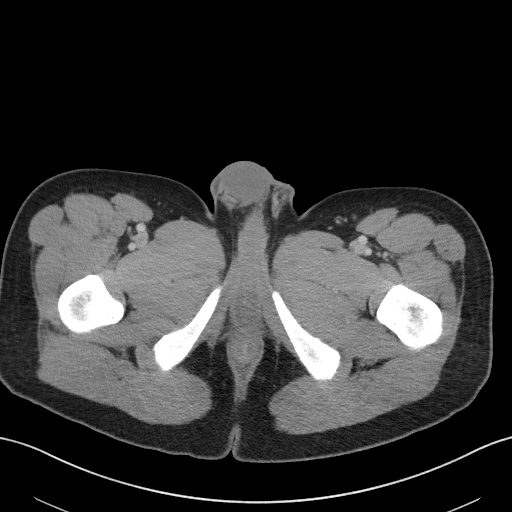
[im 9/105  bone]
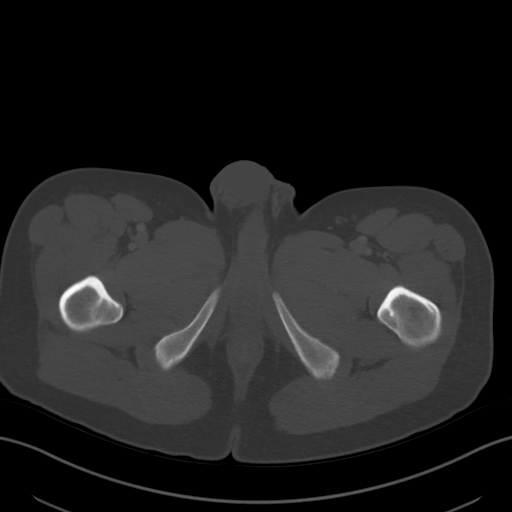
[im 17/105  soft-tissue]
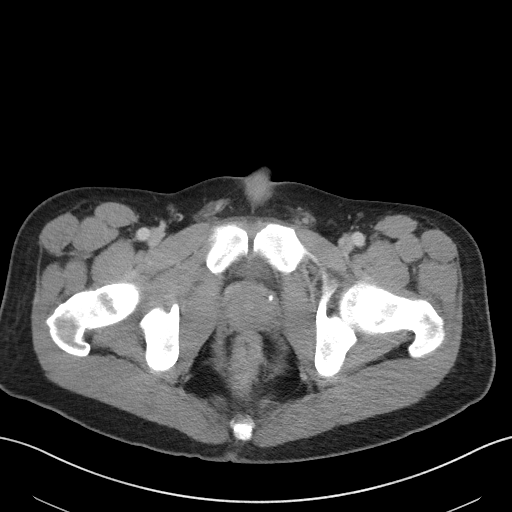
[im 25/105  soft-tissue]
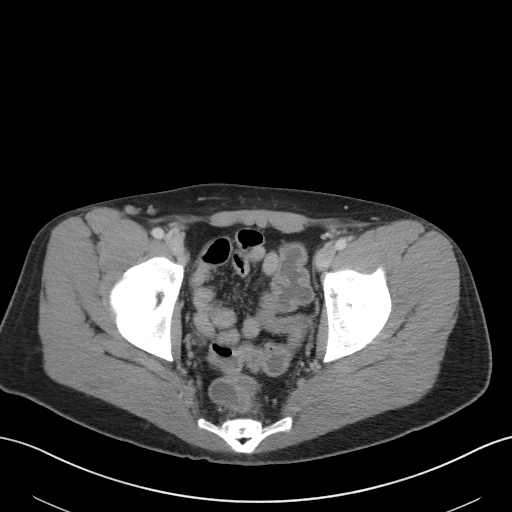
[im 34/105  soft-tissue]
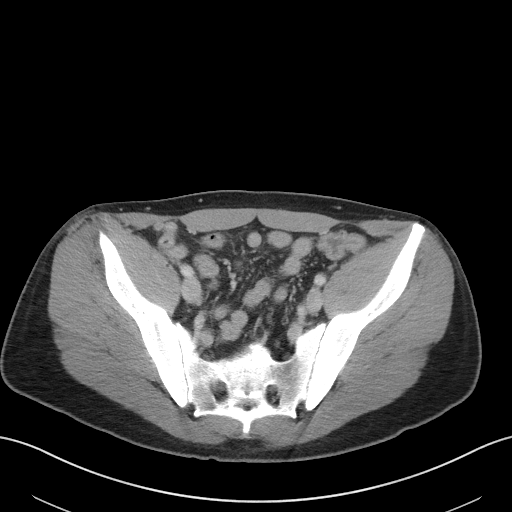
[im 42/105  soft-tissue]
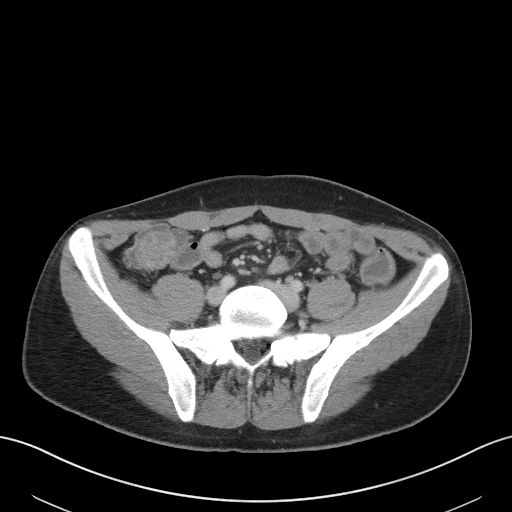
[im 50/105  soft-tissue]
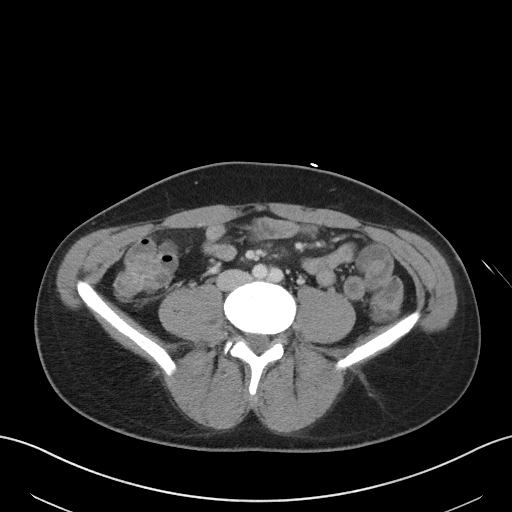
[im 59/105  soft-tissue]
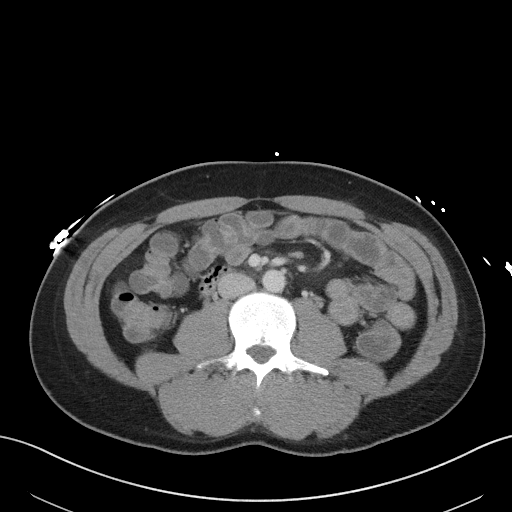
[im 67/105  soft-tissue]
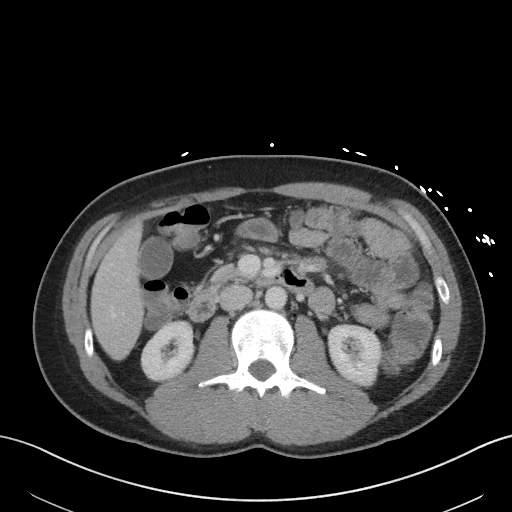
[im 75/105  soft-tissue]
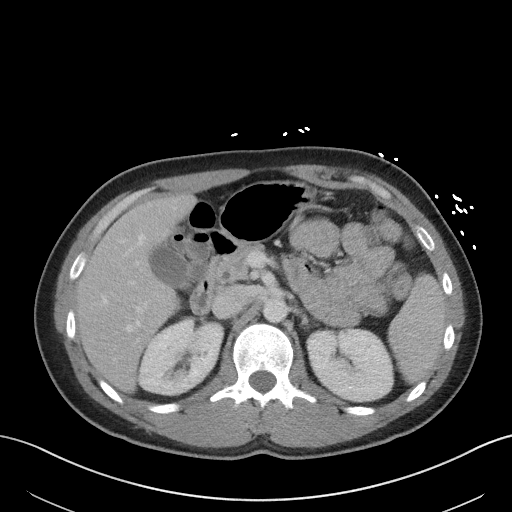
[im 75/105  bone]
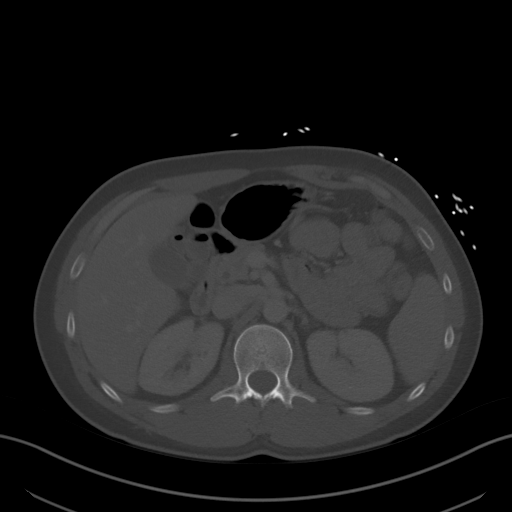
[im 84/105  soft-tissue]
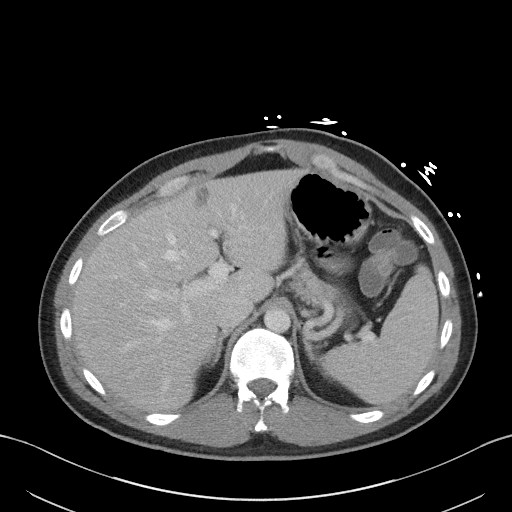
[im 92/105  soft-tissue]
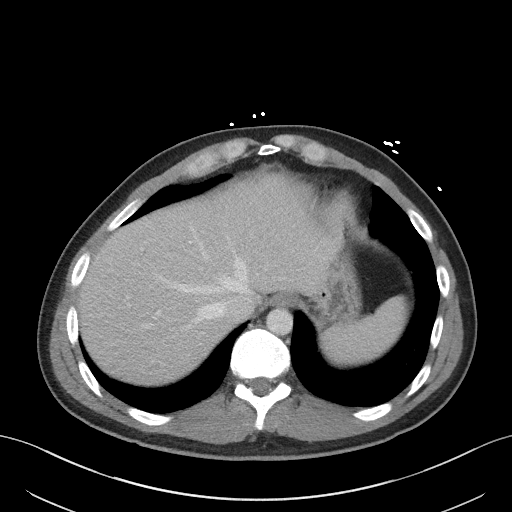
[im 100/105  soft-tissue]
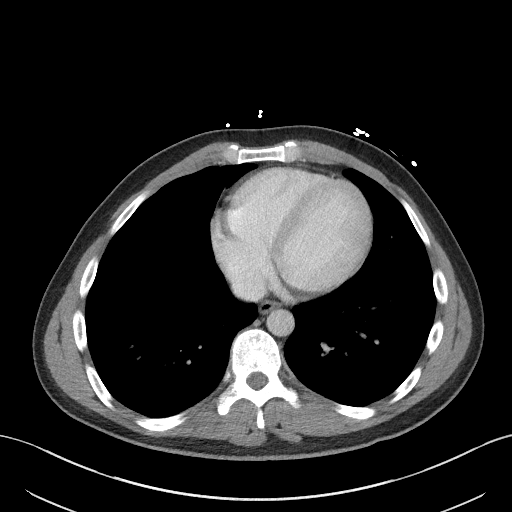

[Series 5: coronal st · coronal · 0.71mm/px · 3 of 81 slices shown]
[im 27/81  soft-tissue]
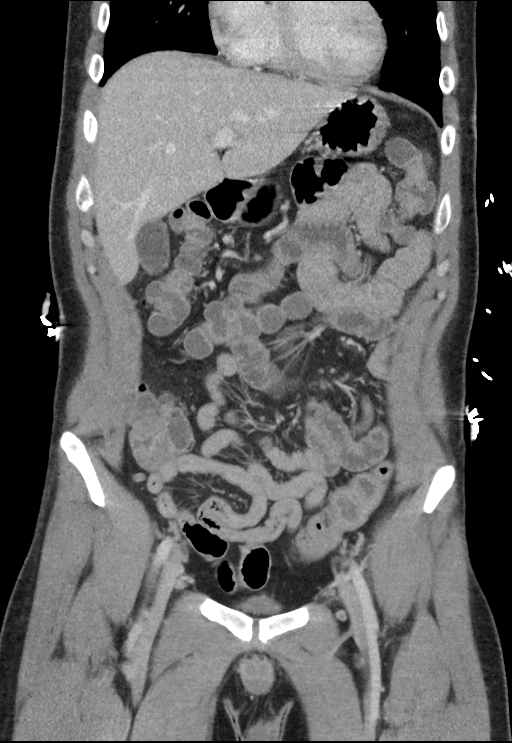
[im 36/81  soft-tissue]
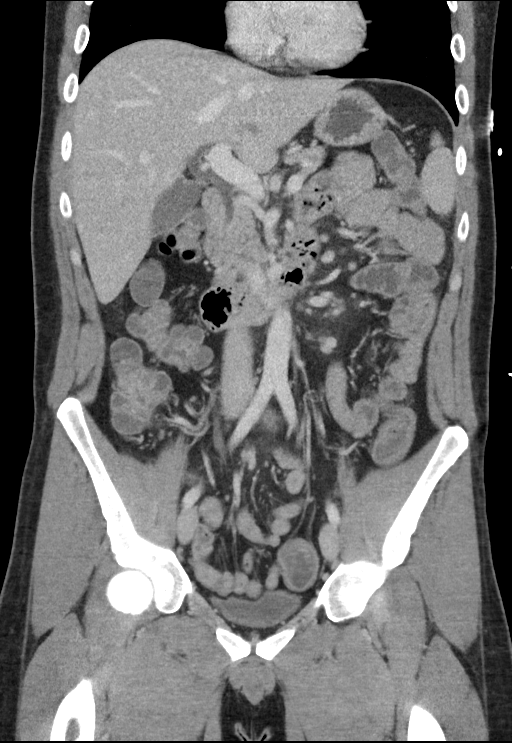
[im 45/81  soft-tissue]
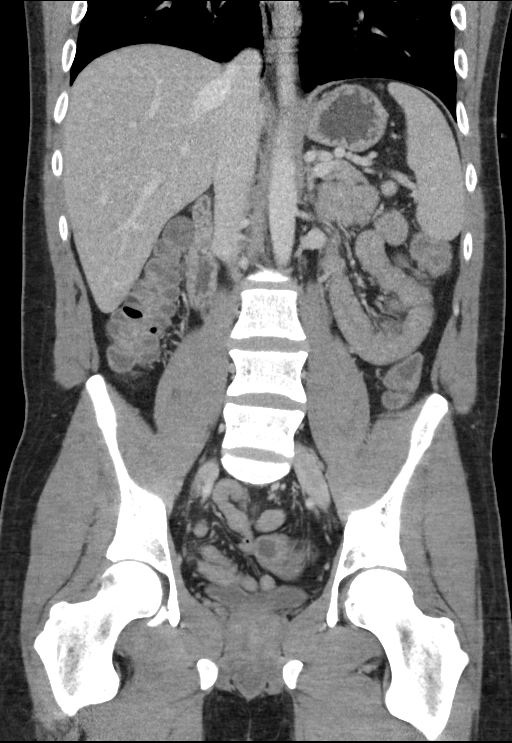

[15 of 46 positions shown; findings below may reference images not displayed]

FINDINGS: Lower chest: Stable 3 mm subpleural nodule right lower lobe. No
acute consolidation or pleural effusion. Normal heart size.

Hepatobiliary: Mild fatty infiltration. Small focal hypodensities
near falciform ligament could relate to more focal fatty change. No
biliary dilatation. No calcified gallstones. No wall thickening.

Pancreas: Unremarkable. No pancreatic ductal dilatation or
surrounding inflammatory changes.

Spleen: Normal in size without focal abnormality.

Adrenals/Urinary Tract: Adrenal glands are unremarkable. Kidneys are
normal, without renal calculi, focal lesion, or hydronephrosis.
Bladder is unremarkable.

Stomach/Bowel: Stomach is nonenlarged. There is no dilated small
bowel. Appendix is normal. No significant colon wall thickening.
Mild mucosal enhancement of nondilated small bowel loops within the
left upper quadrant with scattered mucosal enhancement of the
descending and rectosigmoid colon without associated wall
thickening.

Vascular/Lymphatic: Aorta is of normal caliber. Scattered
subcentimeter central mesenteric nonspecific lymph nodes.

Reproductive: Prostate is unremarkable.

Other: No free air or free fluid. Small amount of fat in the
umbilicus.

Musculoskeletal: No acute or significant osseous findings.
IMPRESSION: 1. No evidence for bowel obstruction, free air or free fluid. Normal
appendix.
2. Scattered mucosal enhancement of nondilated small bowel as well
as the descending colon and rectosigmoid colon without associated
wall thickening. Findings could be secondary to an enteritis.
3. Mild hepatic steatosis

## 2018-03-28 ENCOUNTER — Telehealth: Payer: Self-pay

## 2018-03-28 NOTE — Telephone Encounter (Signed)
lmov to rs appt due to schedule change Dr. Mariah MillingGollan with not be in the office 4/17 at 1140 .  Provider can see patient 4/16 at 4 pm .  LMOV to see if the new time is ok .

## 2018-03-31 NOTE — Telephone Encounter (Signed)
No ans no vm   °

## 2018-04-01 NOTE — Telephone Encounter (Signed)
lmov .  3 attempts to r/s per Dr. Mariah MillingGollan.  Mailed letter. Cancelled appt.

## 2018-04-09 ENCOUNTER — Ambulatory Visit: Payer: PRIVATE HEALTH INSURANCE | Admitting: Cardiovascular Disease

## 2019-09-09 ENCOUNTER — Other Ambulatory Visit: Payer: Self-pay

## 2019-09-09 ENCOUNTER — Encounter (HOSPITAL_COMMUNITY): Payer: Self-pay

## 2019-09-09 ENCOUNTER — Emergency Department (HOSPITAL_COMMUNITY)
Admission: EM | Admit: 2019-09-09 | Discharge: 2019-09-09 | Disposition: A | Discharge status: Home / Self Care | Payer: Self-pay | Attending: Emergency Medicine | Admitting: Emergency Medicine

## 2019-09-09 DIAGNOSIS — Z79899 Other long term (current) drug therapy: Secondary | ICD-10-CM | POA: Insufficient documentation

## 2019-09-09 DIAGNOSIS — F17220 Nicotine dependence, chewing tobacco, uncomplicated: Secondary | ICD-10-CM | POA: Insufficient documentation

## 2019-09-09 DIAGNOSIS — K0889 Other specified disorders of teeth and supporting structures: Secondary | ICD-10-CM | POA: Insufficient documentation

## 2019-09-09 MED ORDER — PENICILLIN V POTASSIUM 500 MG PO TABS: 500.0000 mg | ORAL_TABLET | Freq: Four times a day (QID) | ORAL | 0 refills | Status: AC

## 2019-09-09 MED ORDER — NAPROXEN 500 MG PO TABS: 500.0000 mg | ORAL_TABLET | Freq: Two times a day (BID) | ORAL | 0 refills | Status: DC

## 2019-09-09 MED ORDER — HYDROCODONE-ACETAMINOPHEN 5-325 MG PO TABS
1.0000 | ORAL_TABLET | Freq: Once | ORAL | Status: AC
  Administered 2019-09-09: 1 via ORAL
  Filled 2019-09-09: qty 1

## 2019-09-09 MED ORDER — NAPROXEN 250 MG PO TABS
500.0000 mg | ORAL_TABLET | Freq: Once | ORAL | Status: AC
  Administered 2019-09-09: 500 mg via ORAL
  Filled 2019-09-09: qty 2

## 2019-09-09 MED ORDER — PENICILLIN V POTASSIUM 500 MG PO TABS: 500.0000 mg | ORAL_TABLET | Freq: Four times a day (QID) | ORAL | 0 refills | Status: DC

## 2019-09-09 MED ORDER — PENICILLIN V POTASSIUM 250 MG PO TABS
500.0000 mg | ORAL_TABLET | Freq: Once | ORAL | Status: AC
  Administered 2019-09-09: 500 mg via ORAL
  Filled 2019-09-09: qty 2

## 2019-09-09 NOTE — ED Triage Notes (Signed)
Pt arrived via ems. Top right molar chipped 1 year ago. hasn't seen dentist. Nathan [name] he woke up with severe pain and swelling.

## 2019-09-09 NOTE — ED Provider Notes (Signed)
[facility] Provider Note   CSN: [id number] Arrival date & time: 09/09/19  0136     History   Chief Complaint Chief Complaint  Patient presents with  . Dental Pain    HPI Taijon Vink is a 33 y.o. male.     Patient here with right upper molar dental pain.  States he fractured his tooth about 1 year ago.  It has not bothered him until 2 days ago.  Has had increased pain, swelling at home over the past 2 days.  Denies any recent injury.  Has had some difficulty swallowing no difficulty breathing.  No fever or vomiting.  Has been taking ibuprofen at home without relief.  Does not have a dentist.  History of opiate abuse.  States he is no longer on Suboxone and no longer using opiates  The history is provided by the patient and the EMS personnel.  Dental Pain Associated symptoms: no drooling, no fever and no headaches     Past Medical History:  Diagnosis Date  . Narcotic abuse in remission [facility])     Patient Active Problem List   Diagnosis Date Noted  . Gastroenteritis 12/23/2016  . Dehydration 12/23/2016  . Nausea vomiting and diarrhea 12/23/2016  . Orthostatic hypotension 12/23/2016    Past Surgical History:  Procedure Laterality Date  . JOINT REPLACEMENT          Home Medications    Prior to Admission medications   Medication Sig Start Date End Date Taking? Authorizing Provider  buprenorphine-naloxone (SUBOXONE) 8-2 MG SUBL SL tablet Place 1 tablet under the tongue 2 (two) times daily.     [provider]    Family History Family History  Problem Relation Age of Onset  . Hyperlipidemia Mother   . Hyperlipidemia Father   . Diabetes Father     Social History Social History   Tobacco Use  . Smoking status: Never Smoker  . Smokeless tobacco: Current User  Substance Use Topics  . Alcohol use: No    Alcohol/week: 0.0 standard drinks  . Drug use: No     Allergies   Patient has no known allergies.   Review of Systems  Review of Systems  Constitutional: Negative for activity change, appetite change and fever.  HENT: Positive for dental problem. Negative for drooling and sinus pressure.   Eyes: Negative for visual disturbance.  Respiratory: Negative for cough, chest tightness and shortness of breath.   Cardiovascular: Negative for chest pain.  Gastrointestinal: Negative for abdominal pain, nausea and vomiting.  Genitourinary: Negative for dysuria and hematuria.  Musculoskeletal: Negative for arthralgias and myalgias.  Skin: Negative for wound.  Neurological: Negative for dizziness, weakness and headaches.    all other systems are negative except as noted in the HPI and PMH.    Physical Exam Updated Vital Signs BP (!) 142/82 (BP Location: Right Arm)   Pulse 86   Temp 98 F (36.7 C) (Oral)   Resp 17   SpO2 95%   Physical Exam Vitals signs and nursing note reviewed.  Constitutional:      General: He is not in acute distress.    Appearance: He is well-developed.  HENT:     Head: Normocephalic and atraumatic.     Mouth/Throat:     Pharynx: No oropharyngeal exudate.     Comments: Right second upper molar is chipped.  There is no fluctuance.  Floor mouth is soft, no drooling or trismus Eyes:     Conjunctiva/sclera: Conjunctivae normal.  Pupils: Pupils are equal, round, and reactive to light.  Neck:     Musculoskeletal: Normal range of motion and neck supple.     Comments: No meningismus. Cardiovascular:     Rate and Rhythm: Normal rate and regular rhythm.     Heart sounds: Normal heart sounds. No murmur.  Pulmonary:     Effort: Pulmonary effort is normal. No respiratory distress.     Breath sounds: Normal breath sounds.  Abdominal:     Palpations: Abdomen is soft.     Tenderness: There is no abdominal tenderness. There is no guarding or rebound.  Musculoskeletal: Normal range of motion.        General: No tenderness.  Skin:    General: Skin is warm.     Capillary Refill: Capillary  refill takes less than 2 seconds.  Neurological:     General: No focal deficit present.     Mental Status: He is alert and oriented to person, place, and time. Mental status is at baseline.     Cranial Nerves: No cranial nerve deficit.     Motor: No abnormal muscle tone.     Coordination: Coordination normal.     Comments: No ataxia on finger to nose bilaterally. No pronator drift. 5/5 strength throughout. CN 2-12 intact.Equal grip strength. Sensation intact.   Psychiatric:        Behavior: Behavior normal.      ED Treatments / Results  Labs (all labs ordered are listed, but only abnormal results are displayed) Labs Reviewed - No data to display  EKG None  Radiology No results found.  Procedures Procedures (including critical care time)  Medications Ordered in ED Medications  naproxen (NAPROSYN) tablet 500 mg (has no administration in time range)  HYDROcodone-acetaminophen (NORCO/VICODIN) 5-325 MG per tablet 1 tablet (has no administration in time range)  penicillin v potassium (VEETID) tablet 500 mg (has no administration in time range)     Initial Impression / Assessment and Plan / ED Course  I have reviewed the triage vital signs and the nursing notes.  Pertinent labs & imaging results that were available during my care of the patient were reviewed by me and considered in my medical decision making (see chart for details).       Dental pain at site of fractured tooth.  No evidence of Ludwig angina or drainable abscess.  We will treat with nonnarcotic pain medication, antibiotics, dental follow-up for tooth extraction.  Referred to dentistry for follow-up soon as possible.  Narcotic database shows that patient is still receiving Suboxone on a regular basis, last filled September 9 though he denies this. No narcotic prescriptions will be provided.  Final Clinical Impressions(s) / ED Diagnoses   Final diagnoses:  Pain, dental    ED Discharge Orders    None        Henya Aguallo, Jeannett SeniorStephen, MD 09/09/19 0425

## 2019-09-09 NOTE — Discharge Instructions (Signed)
Follow-up with a dentist from the attached list for your tooth to be pulled.  Take the antibiotics as prescribed.  Return to the ED with new or worsening symptoms or difficulty breathing or difficulty swallowing.

## 2020-01-28 ENCOUNTER — Ambulatory Visit (INDEPENDENT_AMBULATORY_CARE_PROVIDER_SITE_OTHER): Payer: PRIVATE HEALTH INSURANCE

## 2020-01-28 ENCOUNTER — Other Ambulatory Visit: Payer: Self-pay

## 2020-01-28 ENCOUNTER — Encounter: Payer: Self-pay | Admitting: Cardiology

## 2020-01-28 ENCOUNTER — Ambulatory Visit (INDEPENDENT_AMBULATORY_CARE_PROVIDER_SITE_OTHER): Payer: PRIVATE HEALTH INSURANCE | Admitting: Cardiology

## 2020-01-28 VITALS — BP 138/86 | HR 85 | Ht 71.0 in | Wt 204.2 lb

## 2020-01-28 DIAGNOSIS — R002 Palpitations: Secondary | ICD-10-CM | POA: Diagnosis not present

## 2020-01-28 DIAGNOSIS — R079 Chest pain, unspecified: Secondary | ICD-10-CM

## 2020-01-28 DIAGNOSIS — I499 Cardiac arrhythmia, unspecified: Secondary | ICD-10-CM

## 2020-01-28 NOTE — Patient Instructions (Signed)
Medication Instructions:  Your physician recommends that you continue on your current medications as directed. Please refer to the Current Medication list given to you today.  *If you need a refill on your cardiac medications before your next appointment, please call your pharmacy*  Lab Work: none If you have labs (blood work) drawn today and your tests are completely normal, you will receive your results only by: Marland Kitchen MyChart Message (if you have MyChart) OR . A paper copy in the mail If you have any lab test that is abnormal or we need to change your treatment, we will call you to review the results.  Testing/Procedures: 1- ECHOCARDIOGRAM - Your physician has requested that you have an echocardiogram. Echocardiography is a painless test that uses sound waves to create images of your heart. It provides your doctor with information about the size and shape of your heart and how well your heart's chambers and valves are working. This procedure takes approximately one hour. There are no restrictions for this procedure. You may get an IV, if needed, to receive an ultrasound enhancing agent through to better visualize your heart.    2- ZIO MONITOR - Wear for 14 days. A Zio Patch Event Heart monitor will be applied to your chest today.  You will wear the patch for 14 days. After 24 hours, you may shower with the heart monitor on. If you feel any symptoms, you may press and release the button in the middle of the monitor.  Follow-Up: At [facility], you and your health needs are our priority.  As part of our continuing mission to provide you with exceptional heart care, we have created designated Provider Care Teams.  These Care Teams include your primary Cardiologist (physician) and Advanced Practice Providers (APPs -  Physician Assistants and Nurse Practitioners) who all work together to provide you with the care you need, when you need it.  Your next appointment:   4-6 week(s)  The format  for your next appointment:   In Person  Provider:   Debbe Odea, MD    Echocardiogram An echocardiogram is a procedure that uses painless sound waves (ultrasound) to produce an image of the heart. Images from an echocardiogram can provide important information about:  Signs of coronary artery disease (CAD).  Aneurysm detection. An aneurysm is a weak or damaged part of an artery wall that bulges out from the normal force of blood pumping through the body.  Heart size and shape. Changes in the size or shape of the heart can be associated with certain conditions, including heart failure, aneurysm, and CAD.  Heart muscle function.  Heart valve function.  Signs of a past heart attack.  Fluid buildup around the heart.  Thickening of the heart muscle.  A tumor or infectious growth around the heart valves. Tell a health care provider about:  Any allergies you have.  All medicines you are taking, including vitamins, herbs, eye drops, creams, and over-the-counter medicines.  Any blood disorders you have.  Any surgeries you have had.  Any medical conditions you have.  Whether you are pregnant or may be pregnant. What are the risks? Generally, this is a safe procedure. However, problems may occur, including:  Allergic reaction to dye (contrast) that may be used during the procedure. What happens before the procedure? No specific preparation is needed. You may eat and drink normally. What happens during the procedure?   An IV tube may be inserted into one of your veins.  You may  receive contrast through this tube. A contrast is an injection that improves the quality of the pictures from your heart.  A gel will be applied to your chest.  A wand-like tool (transducer) will be moved over your chest. The gel will help to transmit the sound waves from the transducer.  The sound waves will harmlessly bounce off of your heart to allow the heart images to be captured in  real-time motion. The images will be recorded on a computer. The procedure may vary among health care providers and hospitals. What happens after the procedure?  You may return to your normal, everyday life, including diet, activities, and medicines, unless your health care provider tells you not to do that. Summary  An echocardiogram is a procedure that uses painless sound waves (ultrasound) to produce an image of the heart.  Images from an echocardiogram can provide important information about the size and shape of your heart, heart muscle function, heart valve function, and fluid buildup around your heart.  You do not need to do anything to prepare before this procedure. You may eat and drink normally.  After the echocardiogram is completed, you may return to your normal, everyday life, unless your health care provider tells you not to do that. This information is not intended to replace advice given to you by your health care provider. Make sure you discuss any questions you have with your health care provider. Document Revised: 04/02/2019 Document Reviewed: 01/12/2017 Elsevier Patient Education  [employer].

## 2020-01-28 NOTE — Progress Notes (Signed)
Cardiology Office Note:    Date:  01/28/2020   ID:  Nathan [name], DOB February 27, 1986, MRN [id number]  PCP:  Patient, No Pcp Per  Cardiologist:  Debbe Odea, MD  Electrophysiologist:  None   Referring MD: No ref. provider found   Chief Complaint  Patient presents with  . New Patient (Initial Visit)    Self Referral// irregualr HR upon exertion, stabbing pain; heart murmur in youth; family hx of stroke. Meds reviewed verbally with patient.     History of Present Illness:    Nathan [name] is a 34 y.o. male with a hx of narcotic abuse who presents due to irregular heart rate.  Patient states having symptoms of prominent heartbeat and chest pressure for almost a year now.  Symptoms typically occur when he is exerting himself such as during intercourse and also working.  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  Symptoms typically alcohol 2-3 times a week and he states having a sudden and prominent heartbeat which is very noticeable and causing chest pressure which he rates as roughly a 3 out of 10.  Symptoms are not related to exertion.  He also noticed his heart beat is irregular at times.  Denies shortness of breath, palpitations, syncope.  Past Medical History:  Diagnosis Date  . Narcotic abuse in remission [facility])     Past Surgical History:  Procedure Laterality Date  . JOINT REPLACEMENT      Current Medications: Current Meds  Medication Sig  . buprenorphine-naloxone (SUBOXONE) 8-2 MG SUBL SL tablet Place 1 tablet under the tongue 2 (two) times daily.      Allergies:   Patient has no known allergies.   Social History   Socioeconomic History  . Marital status: Married    Spouse name: Not on file  . Number of children: Not on file  . Years of education: Not on file  . Highest education level: Not on file  Occupational History  . Not on file  Tobacco Use  . Smoking status: Never Smoker  . Smokeless tobacco: Current User  Substance and Sexual  Activity  . Alcohol use: No    Alcohol/week: 0.0 standard drinks  . Drug use: No  . Sexual activity: Not on file  Other Topics Concern  . Not on file  Social History Narrative  . Not on file   Social Determinants of Health   Financial Resource Strain:   . Difficulty of Paying Living Expenses: Not on file  Food Insecurity:   . Worried About Programme researcher, broadcasting/film/video in the Last Year: Not on file  . Ran Out of Food in the Last Year: Not on file  Transportation Needs:   . Lack of Transportation (Medical): Not on file  . Lack of Transportation (Non-Medical): Not on file  Physical Activity:   . Days of Exercise per Week: Not on file  . Minutes of Exercise per Session: Not on file  Stress:   . Feeling of Stress : Not on file  Social Connections:   . Frequency of Communication with Friends and Family: Not on file  . Frequency of Social Gatherings with Friends and Family: Not on file  . Attends Religious Services: Not on file  . Active Member of Clubs or Organizations: Not on file  . Attends Banker Meetings: Not on file  . Marital Status: Not on file     Family History: The patient's family history includes Diabetes in  his father; Hyperlipidemia in his father and mother.  ROS:   Please see the history of present illness.     All other systems reviewed and are negative.  EKGs/Labs/Other Studies Reviewed:    The following studies were reviewed today:   EKG:  EKG is  ordered today.  The ekg ordered today demonstrates normal sinus rhythm, normal ECG.  Recent Labs: No results found for requested labs within last 8760 hours.  Recent Lipid Panel No results found for: CHOL, TRIG, HDL, CHOLHDL, VLDL, LDLCALC, LDLDIRECT  Physical Exam:    VS:  BP 138/86 (BP Location: Right Arm, Patient Position: Sitting, Cuff Size: Normal)   Pulse 85   Ht 5\' 11"  (1.803 m)   Wt 204 lb 4 oz (92.6 kg)   SpO2 99%   BMI 28.49 kg/m     Wt Readings from Last 3 Encounters:  01/28/20 204  lb 4 oz (92.6 kg)  12/23/16 183 lb 14.4 oz (83.4 kg)  09/30/15 215 lb (97.5 kg)     GEN:  Well nourished, well developed in no acute distress HEENT: Normal NECK: No JVD; No carotid bruits LYMPHATICS: No lymphadenopathy CARDIAC: RRR, no murmurs, rubs, gallops RESPIRATORY:  Clear to auscultation without rales, wheezing or rhonchi  ABDOMEN: Soft, non-tender, non-distended MUSCULOSKELETAL:  No edema; No deformity  SKIN: Warm and dry NEUROLOGIC:  Alert and oriented x 3 PSYCHIATRIC:  Normal affect   ASSESSMENT:    1. Awareness of heart beat   2. Irregular heartbeat   3. Chest pain of uncertain etiology    PLAN:      1. Patient with history of worsening prominent heartbeat and irregular heartbeats.  He does not have any cardiac risk factors.  Symptoms of irregular and prominent heartbeat are worse with exertion.  Will get cardiac monitor x2 weeks to evaluate for any arrhythmias.  Due to left-sided pressure with prominent heartbeat, will evaluate for any cardiac structural abnormalities with an echocardiogram.  Follow-up after echo and cardiac monitor.   Medication Adjustments/Labs and Tests Ordered: Current medicines are reviewed at length with the patient today.  Concerns regarding medicines are outlined above.  Orders Placed This Encounter  Procedures  . LONG TERM MONITOR (3-14 DAYS)  . EKG 12-Lead  . ECHOCARDIOGRAM COMPLETE   No orders of the defined types were placed in this encounter.   Patient Instructions  Medication Instructions:  Your physician recommends that you continue on your current medications as directed. Please refer to the Current Medication list given to you today.  *If you need a refill on your cardiac medications before your next appointment, please call your pharmacy*  Lab Work: none If you have labs (blood work) drawn today and your tests are completely normal, you will receive your results only by: Marland Kitchen MyChart Message (if you have MyChart) OR . A  paper copy in the mail If you have any lab test that is abnormal or we need to change your treatment, we will call you to review the results.  Testing/Procedures: 1- ECHOCARDIOGRAM - Your physician has requested that you have an echocardiogram. Echocardiography is a painless test that uses sound waves to create images of your heart. It provides your doctor with information about the size and shape of your heart and how well your heart's chambers and valves are working. This procedure takes approximately one hour. There are no restrictions for this procedure. You may get an IV, if needed, to receive an ultrasound enhancing agent through to better visualize your heart.  2- ZIO MONITOR - Wear for 14 days. A Zio Patch Event Heart monitor will be applied to your chest today.  You will wear the patch for 14 days. After 24 hours, you may shower with the heart monitor on. If you feel any symptoms, you may press and release the button in the middle of the monitor.  Follow-Up: At [facility], you and your health needs are our priority.  As part of our continuing mission to provide you with exceptional heart care, we have created designated Provider Care Teams.  These Care Teams include your primary Cardiologist (physician) and Advanced Practice Providers (APPs -  Physician Assistants and Nurse Practitioners) who all work together to provide you with the care you need, when you need it.  Your next appointment:   4-6 week(s)  The format for your next appointment:   In Person  Provider:   Debbe Odea, MD    Echocardiogram An echocardiogram is a procedure that uses painless sound waves (ultrasound) to produce an image of the heart. Images from an echocardiogram can provide important information about:  Signs of coronary artery disease (CAD).  Aneurysm detection. An aneurysm is a weak or damaged part of an artery wall that bulges out from the normal force of blood pumping through the  body.  Heart size and shape. Changes in the size or shape of the heart can be associated with certain conditions, including heart failure, aneurysm, and CAD.  Heart muscle function.  Heart valve function.  Signs of a past heart attack.  Fluid buildup around the heart.  Thickening of the heart muscle.  A tumor or infectious growth around the heart valves. Tell a health care provider about:  Any allergies you have.  All medicines you are taking, including vitamins, herbs, eye drops, creams, and over-the-counter medicines.  Any blood disorders you have.  Any surgeries you have had.  Any medical conditions you have.  Whether you are pregnant or may be pregnant. What are the risks? Generally, this is a safe procedure. However, problems may occur, including:  Allergic reaction to dye (contrast) that may be used during the procedure. What happens before the procedure? No specific preparation is needed. You may eat and drink normally. What happens during the procedure?   An IV tube may be inserted into one of your veins.  You may receive contrast through this tube. A contrast is an injection that improves the quality of the pictures from your heart.  A gel will be applied to your chest.  A wand-like tool (transducer) will be moved over your chest. The gel will help to transmit the sound waves from the transducer.  The sound waves will harmlessly bounce off of your heart to allow the heart images to be captured in real-time motion. The images will be recorded on a computer. The procedure may vary among health care providers and hospitals. What happens after the procedure?  You may return to your normal, everyday life, including diet, activities, and medicines, unless your health care provider tells you not to do that. Summary  An echocardiogram is a procedure that uses painless sound waves (ultrasound) to produce an image of the heart.  Images from an echocardiogram can  provide important information about the size and shape of your heart, heart muscle function, heart valve function, and fluid buildup around your heart.  You do not need to do anything to prepare before this procedure. You may eat and drink normally.  After the echocardiogram is  completed, you may return to your normal, everyday life, unless your health care provider tells you not to do that. This information is not intended to replace advice given to you by your health care provider. Make sure you discuss any questions you have with your health care provider. Document Revised: 04/02/2019 Document Reviewed: 01/12/2017 Elsevier Patient Education  2020 [employer].      Signed, Debbe Odea, MD  01/28/2020 12:40 PM    [facility]

## 2020-02-24 ENCOUNTER — Other Ambulatory Visit: Payer: Self-pay | Admitting: Cardiology

## 2020-02-24 DIAGNOSIS — R079 Chest pain, unspecified: Secondary | ICD-10-CM

## 2020-03-01 ENCOUNTER — Other Ambulatory Visit: Payer: PRIVATE HEALTH INSURANCE

## 2020-03-08 ENCOUNTER — Telehealth: Payer: Self-pay

## 2020-03-08 NOTE — Telephone Encounter (Signed)
Tried to call patient but no answer. No voicemail was setup for me to leave a message.

## 2020-03-08 NOTE — Telephone Encounter (Signed)
-----   Message from Debbe Odea, MD sent at 03/08/2020 10:23 AM EDT ----- Benign cardiac monitor with no significant arrhythmias detected.

## 2020-03-09 NOTE — Telephone Encounter (Signed)
Tried calling patient. No answer and no voicemail set up for me to leave a message. 

## 2020-03-10 ENCOUNTER — Encounter: Payer: Self-pay | Admitting: *Deleted

## 2020-03-10 NOTE — Telephone Encounter (Signed)
Tried calling patient. No answer and no voicemail set up for me to leave a message. Letter with results mailed to patient.

## 2020-03-11 ENCOUNTER — Ambulatory Visit: Payer: Self-pay | Admitting: Cardiology

## 2020-12-10 ENCOUNTER — Encounter: Payer: Self-pay | Admitting: Emergency Medicine

## 2020-12-10 ENCOUNTER — Ambulatory Visit
Admission: EM | Admit: 2020-12-10 | Discharge: 2020-12-10 | Disposition: A | Discharge status: Home / Self Care | Payer: PRIVATE HEALTH INSURANCE | Attending: Emergency Medicine | Admitting: Emergency Medicine

## 2020-12-10 ENCOUNTER — Ambulatory Visit: Payer: Self-pay

## 2020-12-10 ENCOUNTER — Ambulatory Visit: Admit: 2020-12-10 | Disposition: A | Discharge status: Home / Self Care | Payer: Self-pay | Source: Home / Self Care

## 2020-12-10 DIAGNOSIS — L239 Allergic contact dermatitis, unspecified cause: Secondary | ICD-10-CM | POA: Diagnosis not present

## 2020-12-10 MED ORDER — DEXAMETHASONE SODIUM PHOSPHATE 10 MG/ML IJ SOLN
10.0000 mg | Freq: Once | INTRAMUSCULAR | Status: AC
  Administered 2020-12-10: 10:00:00 10 mg via INTRAMUSCULAR

## 2020-12-10 MED ORDER — PREDNISONE 10 MG (21) PO TBPK: ORAL_TABLET | Freq: Every day | ORAL | 0 refills | Status: AC

## 2020-12-10 NOTE — ED Triage Notes (Signed)
Red rash to lower abd , bilateral groin , inner thighs and bilateral inner elbows x 1 week.  Was not initially itchy but has became itchy in the last 2-3 days

## 2020-12-10 NOTE — Discharge Instructions (Addendum)
Continue to take Zyrtec as prescribed  Prednisone was prescribed Take as prescribed and to completion Limit hot shower and baths, or bathe with warm water.   Moisturize skin daily Follow up with PCP if symptoms persists Return or go to the ER if you have any new or worsening symptoms

## 2020-12-10 NOTE — ED Provider Notes (Addendum)
[facility] CARE CENTER   [id number] 12/10/20 Arrival Time: [phone]   Chief Complaint  Patient presents with  . Rash     SUBJECTIVE: History from: patient.  Nathan [name] is a 34 y.o. male who presented to the urgent care for complaint of rash for the past 1 week.  Denies any precipitating event, change in soaps, detergents or anyone with similar symptoms.  Localized the rash to bilateral upper arm, lower abdomen, bilateral groin, inner thigh.described the rash as red and itchy.  Start this medication without relief.  Denies alleviating or aggravating factors.denies similar symptoms in the past.  Denies chills, fever, nausea, vomiting, diarrhea.  ROS: As per HPI.  All other pertinent ROS negative.      Past Medical History:  Diagnosis Date  . Narcotic abuse in remission [facility])    Past Surgical History:  Procedure Laterality Date  . JOINT REPLACEMENT     No Known Allergies No current facility-administered medications on file prior to encounter.   Current Outpatient Medications on File Prior to Encounter  Medication Sig Dispense Refill  . buprenorphine-naloxone (SUBOXONE) 8-2 MG SUBL SL tablet Place 1 tablet under the tongue 2 (two) times daily.      Social History   Socioeconomic History  . Marital status: Married    Spouse name: Not on file  . Number of children: Not on file  . Years of education: Not on file  . Highest education level: Not on file  Occupational History  . Not on file  Tobacco Use  . Smoking status: Never Smoker  . Smokeless tobacco: Current User  Substance and Sexual Activity  . Alcohol use: No    Alcohol/week: 0.0 standard drinks  . Drug use: No  . Sexual activity: Not on file  Other Topics Concern  . Not on file  Social History Narrative  . Not on file   Social Determinants of Health   Financial Resource Strain: Not on file  Food Insecurity: Not on file  Transportation Needs: Not on file  Physical Activity: Not on file  Stress: Not on file   Social Connections: Not on file  Intimate Partner Violence: Not on file   Family History  Problem Relation Age of Onset  . Hyperlipidemia Mother   . Hyperlipidemia Father   . Diabetes Father     OBJECTIVE:  Vitals:   12/10/20 0953 12/10/20 0954  BP:  (!) 131/93  Pulse:  (!) 119  Resp:  19  Temp:  98.1 F (36.7 C)  TempSrc:  Oral  SpO2:  98%  Weight: 200 lb (90.7 kg)   Height: 6' (1.829 m)      Physical Exam Vitals and nursing note reviewed.  Constitutional:      General: He is not in acute distress.    Appearance: Normal appearance. He is normal weight. He is not ill-appearing, toxic-appearing or diaphoretic.  Cardiovascular:     Rate and Rhythm: Normal rate and regular rhythm.     Pulses: Normal pulses.     Heart sounds: Normal heart sounds. No murmur heard. No friction rub. No gallop.   Pulmonary:     Effort: Pulmonary effort is normal. No respiratory distress.     Breath sounds: Normal breath sounds. No stridor. No wheezing, rhonchi or rales.  Chest:     Chest wall: No tenderness.  Skin:    General: Skin is warm.     Findings: Rash present. Rash is macular. Rash is not pustular or scaling.  Neurological:  Mental Status: He is alert and oriented to person, place, and time.      LABS:  No results found for this or any previous visit (from the past 24 hour(s)).   ASSESSMENT & PLAN:  1. Allergic contact dermatitis, unspecified trigger     Meds ordered this encounter  Medications  . predniSONE (STERAPRED UNI-PAK 21 TAB) 10 MG (21) TBPK tablet    Sig: Take by mouth daily. Take 6 tabs by mouth daily  for 1 days, then 5 tabs for 1 days, then 4 tabs for 1 days, then 3 tabs for 1 days, 2 tabs for 1 days, then 1 tab by mouth daily for 1 days    Dispense:  21 tablet    Refill:  0  . dexamethasone (DECADRON) injection 10 mg    Discharge Instructions  Continue to take Zyrtec as prescribed  Prednisone was prescribed Take as prescribed and to  completion Limit hot shower and baths, or bathe with warm water.   Moisturize skin daily Follow up with PCP if symptoms persists Return or go to the ER if you have any new or worsening symptoms  Reviewed expectations re: course of current medical issues. Questions answered. Outlined signs and symptoms indicating need for more acute intervention. Patient verbalized understanding. After Visit Summary given.         Durward Parcel, FNP 12/10/20 1006    Durward Parcel, FNP 12/10/20 1007

## 2022-04-25 ENCOUNTER — Other Ambulatory Visit (HOSPITAL_COMMUNITY): Payer: Self-pay

## 2022-04-25 MED ORDER — AMPHETAMINE-DEXTROAMPHETAMINE 20 MG PO TABS
20.0000 mg | ORAL_TABLET | Freq: Two times a day (BID) | ORAL | 0 refills | Status: AC
  Filled 2022-04-25 – 2022-04-30 (×2): qty 60, 30d supply, fill #0

## 2022-04-25 MED ORDER — AMPHETAMINE-DEXTROAMPHETAMINE 20 MG PO TABS
20.0000 mg | ORAL_TABLET | Freq: Two times a day (BID) | ORAL | 0 refills | Status: AC
  Filled 2022-05-28: qty 60, 30d supply, fill #0

## 2022-04-30 ENCOUNTER — Other Ambulatory Visit (HOSPITAL_COMMUNITY): Payer: Self-pay

## 2022-05-28 ENCOUNTER — Other Ambulatory Visit (HOSPITAL_COMMUNITY): Payer: Self-pay

## 2022-06-20 ENCOUNTER — Other Ambulatory Visit: Payer: Self-pay

## 2022-06-20 ENCOUNTER — Emergency Department (HOSPITAL_COMMUNITY)
Admission: EM | Admit: 2022-06-20 | Discharge: 2022-06-20 | Disposition: A | Discharge status: Home / Self Care | Payer: No Typology Code available for payment source | Attending: Emergency Medicine | Admitting: Emergency Medicine

## 2022-06-20 ENCOUNTER — Encounter (HOSPITAL_COMMUNITY): Payer: Self-pay

## 2022-06-20 DIAGNOSIS — M62830 Muscle spasm of back: Secondary | ICD-10-CM | POA: Insufficient documentation

## 2022-06-20 DIAGNOSIS — M62838 Other muscle spasm: Secondary | ICD-10-CM

## 2022-06-20 MED ORDER — NAPROXEN 500 MG PO TABS: 500.0000 mg | ORAL_TABLET | Freq: Two times a day (BID) | ORAL | 0 refills | Status: AC

## 2022-06-20 MED ORDER — METHOCARBAMOL 500 MG PO TABS: 500.0000 mg | ORAL_TABLET | Freq: Two times a day (BID) | ORAL | 0 refills | Status: AC | PRN

## 2022-06-20 MED ORDER — OXYCODONE-ACETAMINOPHEN 5-325 MG PO TABS: 2.0000 | ORAL_TABLET | Freq: Once | ORAL | Status: DC

## 2022-06-20 NOTE — Discharge Instructions (Signed)
If you develop numbness weakness or any severe worsening symptoms return to the emergency department otherwise take the following medications  Please take Naprosyn, 500mg  by mouth twice daily as needed for pain - this in an antiinflammatory medicine (NSAID) and is similar to ibuprofen - many people feel that it is stronger than ibuprofen and it is easier to take since it is a smaller pill.  Please use this only for 1 week - if your pain persists, you will need to follow up with your doctor in the office for ongoing guidance and pain control.  Please take Robaxin, 500 mg up to 2 or 3 times a day as needed for muscle spasm, this is a muscle relaxer, it may cause generalized weakness, sleepiness and you should not drive or do important things while taking this medication.  This includes driving a vehicle or taking care of young children, these things should not be done while taking this medication.  Thank you for allowing to treat you in the emergency department today.  After reviewing your examination and potential testing that was done it appears that you are safe to go home.  I would like for you to follow-up with your doctor within the next several days, have them obtain your results and follow-up with them to review all of these tests.  If you should develop severe or worsening symptoms return to the emergency department immediately

## 2022-06-20 NOTE — ED Provider Notes (Signed)
[facility] EMERGENCY DEPARTMENT Provider Note   CSN: [id number] Arrival date & time: 06/20/22  1328     History  Chief Complaint  Patient presents with   Spasms    Lower back    Nathan [name] is a 36 y.o. male.  HPI  This patient is a 36 year old male who states that the only medicine he takes is Adderall.  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  The patient reports that the pain came on acutely has been persistent, it seems to radiate bilaterally through the lower back and does not go down the legs, there is no numbness or weakness, there is no pain into the legs.  The patient reports that anytime he tries to move anything the pain comes on, it is better when he lays perfectly still.  He was given ketorolac by paramedics prior to arrival.  Home Medications Prior to Admission medications   Medication Sig Start Date End Date Taking? Authorizing Provider  methocarbamol (ROBAXIN) 500 MG tablet Take 1 tablet (500 mg total) by mouth 2 (two) times daily as needed for muscle spasms. 06/20/22  Yes Eber Hong, MD  naproxen (NAPROSYN) 500 MG tablet Take 1 tablet (500 mg total) by mouth 2 (two) times daily with a meal. 06/20/22  Yes Eber Hong, MD  amphetamine-dextroamphetamine (ADDERALL) 20 MG tablet Take 1 tablet (20 mg total) by mouth 2 (two) times daily. 05/25/22     amphetamine-dextroamphetamine (ADDERALL) 20 MG tablet Take 1 tablet (20 mg total) by mouth 2 (two) times daily. 04/25/22     buprenorphine-naloxone (SUBOXONE) 8-2 MG SUBL SL tablet Place 1 tablet under the tongue 2 (two) times daily.     [provider]  predniSONE (STERAPRED UNI-PAK 21 TAB) 10 MG (21) TBPK tablet Take by mouth daily. Take 6 tabs by mouth daily  for 1 days, then 5 tabs for 1 days, then 4 tabs for 1 days, then 3 tabs for 1 days, 2 tabs for 1  days, then 1 tab by mouth daily for 1 days 12/10/20   Durward Parcel, FNP      Allergies    Patient has no known allergies.    Review of Systems   Review of Systems  All other systems reviewed and are negative.   Physical Exam Updated Vital Signs BP 111/77 (BP Location: Right Arm)   Pulse 90   Temp 98.3 F (36.8 C) (Oral)   Resp (!) 24   Ht 1.829 m (6')   Wt 90 kg   SpO2 100%   BMI 26.91 kg/m  Physical Exam Vitals and nursing note reviewed.  Constitutional:      General: He is not in acute distress.    Appearance: He is well-developed.  HENT:     Head: Normocephalic and atraumatic.     Mouth/Throat:     Pharynx: No oropharyngeal exudate.  Eyes:     General: No scleral icterus.       Right eye: No discharge.        Left eye: No discharge.     Conjunctiva/sclera: Conjunctivae normal.     Pupils: Pupils are equal, round, and reactive to light.  Neck:     Thyroid: No thyromegaly.     Vascular: No JVD.  Cardiovascular:     Rate and Rhythm: Normal  rate and regular rhythm.     Heart sounds: Normal heart sounds. No murmur heard.    No friction rub. No gallop.  Pulmonary:     Effort: Pulmonary effort is normal. No respiratory distress.     Breath sounds: Normal breath sounds. No wheezing or rales.  Abdominal:     General: Bowel sounds are normal. There is no distension.     Palpations: Abdomen is soft. There is no mass.     Tenderness: There is no abdominal tenderness.  Musculoskeletal:        General: Tenderness present. Normal range of motion.     Cervical back: Normal range of motion and neck supple.     Right lower leg: No edema.     Left lower leg: No edema.     Comments: Tenderness across the lumbar spine  Lymphadenopathy:     Cervical: No cervical adenopathy.  Skin:    General: Skin is warm and dry.     Findings: No erythema or rash.  Neurological:     Mental Status: He is alert.     Coordination: Coordination normal.     Comments: No numbness or  weakness going down either of the legs.  Psychiatric:        Behavior: Behavior normal.     ED Results / Procedures / Treatments   Labs (all labs ordered are listed, but only abnormal results are displayed) Labs Reviewed - No data to display  EKG None  Radiology No results found.  Procedures Procedures    Medications Ordered in ED Medications  oxyCODONE-acetaminophen (PERCOCET/ROXICET) 5-325 MG per tablet 2 tablet (has no administration in time range)    ED Course/ Medical Decision Making/ A&P                           Medical Decision Making Risk Prescription drug management.   This patient's exam is consistent with muscle spasm, there is no numbness or weakness going down the legs.  He has full range of motion passively but with active range of motion he has pain in his back.  This is suggestive that this is more muscular.  There could be herniated disc however the patient has no neurologic symptoms.  He will be treated with pain medicines muscle relaxers and anti-inflammatories and encouraged to follow-up outpatient.  I anticipate this will be 7 to 10 days of healing.  Medication management: Patient given Percocet with improvement, prescribed Robaxin and Naprosyn for home.  Imaging: Not indicated for atraumatic back pain without neurologic symptoms        Final Clinical Impression(s) / ED Diagnoses Final diagnoses:  Muscle spasm    Rx / DC Orders ED Discharge Orders          Ordered    naproxen (NAPROSYN) 500 MG tablet  2 times daily with meals        06/20/22 1512    methocarbamol (ROBAXIN) 500 MG tablet  2 times daily PRN        06/20/22 1512              Eber Hong, MD 06/20/22 1514

## 2022-06-20 NOTE — ED Triage Notes (Signed)
Patient arrived from EMS with complaints of lower back spasms following bending over at work. EMS administered 30mg  Toradol, IV 20 R forearm.

## 2022-06-22 ENCOUNTER — Other Ambulatory Visit (HOSPITAL_COMMUNITY): Payer: Self-pay

## 2022-06-22 MED ORDER — AMPHETAMINE-DEXTROAMPHETAMINE 20 MG PO TABS
20.0000 mg | ORAL_TABLET | Freq: Two times a day (BID) | ORAL | 0 refills | Status: DC
  Filled 2022-07-25: qty 60, 30d supply, fill #0

## 2022-06-22 MED ORDER — AMPHETAMINE-DEXTROAMPHETAMINE 20 MG PO TABS
20.0000 mg | ORAL_TABLET | Freq: Two times a day (BID) | ORAL | 0 refills | Status: AC
  Filled 2022-06-25: qty 60, 30d supply, fill #0

## 2022-06-25 ENCOUNTER — Other Ambulatory Visit (HOSPITAL_COMMUNITY): Payer: Self-pay

## 2022-07-25 ENCOUNTER — Other Ambulatory Visit (HOSPITAL_COMMUNITY): Payer: Self-pay

## 2022-08-23 ENCOUNTER — Other Ambulatory Visit (HOSPITAL_COMMUNITY): Payer: Self-pay

## 2022-08-23 MED ORDER — AMPHETAMINE-DEXTROAMPHETAMINE 20 MG PO TABS
20.0000 mg | ORAL_TABLET | Freq: Two times a day (BID) | ORAL | 0 refills | Status: AC
  Filled 2022-09-24: qty 60, 30d supply, fill #0

## 2022-08-23 MED ORDER — AMPHETAMINE-DEXTROAMPHETAMINE 20 MG PO TABS
20.0000 mg | ORAL_TABLET | Freq: Two times a day (BID) | ORAL | 0 refills | Status: AC
  Filled 2022-08-23: qty 60, 30d supply, fill #0

## 2022-09-21 ENCOUNTER — Other Ambulatory Visit (HOSPITAL_COMMUNITY): Payer: Self-pay

## 2022-09-24 ENCOUNTER — Other Ambulatory Visit (HOSPITAL_COMMUNITY): Payer: Self-pay

## 2022-10-22 ENCOUNTER — Other Ambulatory Visit (HOSPITAL_COMMUNITY): Payer: Self-pay

## 2022-10-23 ENCOUNTER — Other Ambulatory Visit (HOSPITAL_COMMUNITY): Payer: Self-pay

## 2022-10-23 MED ORDER — AMPHETAMINE-DEXTROAMPHETAMINE 20 MG PO TABS
20.0000 mg | ORAL_TABLET | Freq: Two times a day (BID) | ORAL | 0 refills | Status: AC
  Filled 2022-10-23: qty 60, 30d supply, fill #0

## 2022-10-23 MED ORDER — AMPHETAMINE-DEXTROAMPHETAMINE 20 MG PO TABS
20.0000 mg | ORAL_TABLET | Freq: Two times a day (BID) | ORAL | 0 refills | Status: AC
  Filled 2022-11-23: qty 60, 30d supply, fill #0

## 2022-11-23 ENCOUNTER — Other Ambulatory Visit (HOSPITAL_COMMUNITY): Payer: Self-pay

## 2022-12-21 ENCOUNTER — Other Ambulatory Visit (HOSPITAL_COMMUNITY): Payer: Self-pay

## 2022-12-21 MED ORDER — AMPHETAMINE-DEXTROAMPHETAMINE 20 MG PO TABS
20.0000 mg | ORAL_TABLET | Freq: Two times a day (BID) | ORAL | 0 refills | Status: AC
  Filled 2022-12-25: qty 60, 30d supply, fill #0

## 2022-12-21 MED ORDER — AMPHETAMINE-DEXTROAMPHETAMINE 20 MG PO TABS
20.0000 mg | ORAL_TABLET | Freq: Two times a day (BID) | ORAL | 0 refills | Status: DC
  Filled 2023-01-22: qty 60, 30d supply, fill #0

## 2022-12-25 ENCOUNTER — Other Ambulatory Visit (HOSPITAL_BASED_OUTPATIENT_CLINIC_OR_DEPARTMENT_OTHER): Payer: Self-pay

## 2022-12-25 ENCOUNTER — Other Ambulatory Visit (HOSPITAL_COMMUNITY): Payer: Self-pay

## 2023-01-22 ENCOUNTER — Other Ambulatory Visit (HOSPITAL_COMMUNITY): Payer: Self-pay

## 2023-01-23 ENCOUNTER — Other Ambulatory Visit (HOSPITAL_COMMUNITY): Payer: Self-pay

## 2023-02-20 ENCOUNTER — Other Ambulatory Visit (HOSPITAL_COMMUNITY): Payer: Self-pay

## 2023-02-20 MED ORDER — AMPHETAMINE-DEXTROAMPHETAMINE 20 MG PO TABS
20.0000 mg | ORAL_TABLET | Freq: Two times a day (BID) | ORAL | 0 refills | Status: AC
  Filled 2023-02-20: qty 60, 30d supply, fill #0

## 2023-02-20 MED ORDER — AMPHETAMINE-DEXTROAMPHETAMINE 20 MG PO TABS
20.0000 mg | ORAL_TABLET | Freq: Two times a day (BID) | ORAL | 0 refills | Status: AC
  Filled 2023-03-22: qty 60, 30d supply, fill #0

## 2023-03-19 ENCOUNTER — Other Ambulatory Visit (HOSPITAL_COMMUNITY): Payer: Self-pay

## 2023-03-22 ENCOUNTER — Other Ambulatory Visit (HOSPITAL_COMMUNITY): Payer: Self-pay

## 2023-04-23 ENCOUNTER — Other Ambulatory Visit (HOSPITAL_COMMUNITY): Payer: Self-pay

## 2023-04-23 MED ORDER — AMPHETAMINE-DEXTROAMPHETAMINE 20 MG PO TABS
20.0000 mg | ORAL_TABLET | Freq: Two times a day (BID) | ORAL | 0 refills | Status: AC
  Filled 2023-04-23: qty 60, 30d supply, fill #0

## 2023-04-23 MED ORDER — AMPHETAMINE-DEXTROAMPHETAMINE 20 MG PO TABS
20.0000 mg | ORAL_TABLET | Freq: Two times a day (BID) | ORAL | 0 refills | Status: AC
  Filled 2023-05-23 (×2): qty 60, 30d supply, fill #0

## 2023-05-22 ENCOUNTER — Other Ambulatory Visit (HOSPITAL_COMMUNITY): Payer: Self-pay

## 2023-05-23 ENCOUNTER — Other Ambulatory Visit (HOSPITAL_COMMUNITY): Payer: Self-pay

## 2023-06-21 ENCOUNTER — Other Ambulatory Visit (HOSPITAL_COMMUNITY): Payer: Self-pay

## 2023-06-21 MED ORDER — AMPHETAMINE-DEXTROAMPHETAMINE 20 MG PO TABS
20.0000 mg | ORAL_TABLET | Freq: Two times a day (BID) | ORAL | 0 refills | Status: DC
  Filled 2023-07-22: qty 60, 30d supply, fill #0

## 2023-06-21 MED ORDER — AMPHETAMINE-DEXTROAMPHETAMINE 20 MG PO TABS: 20.0000 mg | ORAL_TABLET | Freq: Two times a day (BID) | ORAL | 0 refills | Status: DC

## 2023-06-21 MED ORDER — AMPHETAMINE-DEXTROAMPHETAMINE 20 MG PO TABS
20.0000 mg | ORAL_TABLET | Freq: Two times a day (BID) | ORAL | 0 refills | Status: AC
  Filled 2023-06-22: qty 60, 30d supply, fill #0

## 2023-06-22 ENCOUNTER — Other Ambulatory Visit (HOSPITAL_COMMUNITY): Payer: Self-pay

## 2023-07-22 ENCOUNTER — Other Ambulatory Visit (HOSPITAL_COMMUNITY): Payer: Self-pay

## 2023-08-20 ENCOUNTER — Other Ambulatory Visit (HOSPITAL_COMMUNITY): Payer: Self-pay

## 2023-08-21 ENCOUNTER — Other Ambulatory Visit (HOSPITAL_COMMUNITY): Payer: Self-pay

## 2023-08-21 ENCOUNTER — Other Ambulatory Visit: Payer: Self-pay

## 2023-08-21 MED ORDER — AMPHETAMINE-DEXTROAMPHETAMINE 20 MG PO TABS
20.0000 mg | ORAL_TABLET | Freq: Two times a day (BID) | ORAL | 0 refills | Status: AC
  Filled 2023-09-20: qty 60, 30d supply, fill #0

## 2023-08-21 MED ORDER — AMPHETAMINE-DEXTROAMPHETAMINE 20 MG PO TABS
20.0000 mg | ORAL_TABLET | Freq: Two times a day (BID) | ORAL | 0 refills | Status: AC
  Filled 2023-08-21: qty 40, 20d supply, fill #0
  Filled 2023-08-21: qty 20, 10d supply, fill #0
  Filled 2023-08-21: qty 40, 20d supply, fill #0
  Filled 2023-08-21: qty 20, 10d supply, fill #0

## 2023-09-09 ENCOUNTER — Other Ambulatory Visit (HOSPITAL_COMMUNITY): Payer: Self-pay

## 2023-09-20 ENCOUNTER — Other Ambulatory Visit (HOSPITAL_COMMUNITY): Payer: Self-pay

## 2023-10-18 ENCOUNTER — Other Ambulatory Visit (HOSPITAL_COMMUNITY): Payer: Self-pay

## 2023-11-28 ENCOUNTER — Other Ambulatory Visit: Payer: Self-pay

## 2024-02-14 ENCOUNTER — Other Ambulatory Visit (HOSPITAL_COMMUNITY): Payer: Self-pay

## 2024-02-14 MED ORDER — AMPHETAMINE-DEXTROAMPHETAMINE 20 MG PO TABS
20.0000 mg | ORAL_TABLET | Freq: Two times a day (BID) | ORAL | 0 refills | Status: AC
  Filled 2024-02-17: qty 60, 30d supply, fill #0

## 2024-02-14 MED ORDER — AMPHETAMINE-DEXTROAMPHETAMINE 20 MG PO TABS
20.0000 mg | ORAL_TABLET | Freq: Two times a day (BID) | ORAL | 0 refills | Status: AC
  Filled 2024-03-18: qty 60, 30d supply, fill #0

## 2024-02-17 ENCOUNTER — Other Ambulatory Visit (HOSPITAL_COMMUNITY): Payer: Self-pay

## 2024-02-17 ENCOUNTER — Other Ambulatory Visit: Payer: Self-pay

## 2024-03-18 ENCOUNTER — Other Ambulatory Visit (HOSPITAL_COMMUNITY): Payer: Self-pay

## 2024-04-17 ENCOUNTER — Other Ambulatory Visit (HOSPITAL_COMMUNITY): Payer: Self-pay

## 2024-04-17 MED ORDER — AMPHETAMINE-DEXTROAMPHETAMINE 20 MG PO TABS
20.0000 mg | ORAL_TABLET | Freq: Two times a day (BID) | ORAL | 0 refills | Status: AC
  Filled 2024-04-17: qty 60, 30d supply, fill #0

## 2024-04-17 MED ORDER — AMPHETAMINE-DEXTROAMPHETAMINE 20 MG PO TABS
20.0000 mg | ORAL_TABLET | Freq: Two times a day (BID) | ORAL | 0 refills | Status: AC
  Filled 2024-05-19: qty 60, 30d supply, fill #0

## 2024-05-19 ENCOUNTER — Other Ambulatory Visit (HOSPITAL_COMMUNITY): Payer: Self-pay

## 2024-06-16 ENCOUNTER — Other Ambulatory Visit (HOSPITAL_COMMUNITY): Payer: Self-pay

## 2024-06-16 MED ORDER — AMPHETAMINE-DEXTROAMPHETAMINE 20 MG PO TABS
20.0000 mg | ORAL_TABLET | Freq: Two times a day (BID) | ORAL | 0 refills | Status: AC
  Filled 2024-07-16: qty 60, 30d supply, fill #0

## 2024-06-16 MED ORDER — AMPHETAMINE-DEXTROAMPHETAMINE 20 MG PO TABS
20.0000 mg | ORAL_TABLET | Freq: Two times a day (BID) | ORAL | 0 refills | Status: AC
  Filled 2024-06-16: qty 60, 30d supply, fill #0

## 2024-07-15 ENCOUNTER — Other Ambulatory Visit (HOSPITAL_COMMUNITY): Payer: Self-pay

## 2024-07-15 ENCOUNTER — Other Ambulatory Visit: Payer: Self-pay

## 2024-07-16 ENCOUNTER — Other Ambulatory Visit (HOSPITAL_COMMUNITY): Payer: Self-pay
